# Patient Record
Sex: Female | Born: 1970 | Race: White | Hispanic: No | Marital: Married | Smoking: Never smoker
Health system: Southern US, Community
[De-identification: ages and names within clinical notes are randomized; demographics above are authoritative.]

## PROBLEM LIST (undated history)

## (undated) DIAGNOSIS — R011 Cardiac murmur, unspecified: Secondary | ICD-10-CM

## (undated) DIAGNOSIS — R9431 Abnormal electrocardiogram [ECG] [EKG]: Secondary | ICD-10-CM

## (undated) DIAGNOSIS — I340 Nonrheumatic mitral (valve) insufficiency: Secondary | ICD-10-CM

## (undated) DIAGNOSIS — I499 Cardiac arrhythmia, unspecified: Secondary | ICD-10-CM

## (undated) HISTORY — DX: Nonrheumatic mitral (valve) insufficiency: I34.0

## (undated) HISTORY — DX: Cardiac arrhythmia, unspecified: I49.9

## (undated) HISTORY — DX: Cardiac murmur, unspecified: R01.1

## (undated) HISTORY — DX: Abnormal electrocardiogram (ECG) (EKG): R94.31

---

## 2000-07-28 ENCOUNTER — Encounter: Admission: RE | Admit: 2000-07-28 | Discharge: 2000-07-28 | Payer: Self-pay | Admitting: Emergency Medicine

## 2000-07-28 ENCOUNTER — Encounter: Payer: Self-pay | Admitting: Emergency Medicine

## 2002-06-09 ENCOUNTER — Other Ambulatory Visit: Admission: RE | Admit: 2002-06-09 | Discharge: 2002-06-09 | Payer: Self-pay | Admitting: Emergency Medicine

## 2004-10-30 ENCOUNTER — Other Ambulatory Visit: Admission: RE | Admit: 2004-10-30 | Discharge: 2004-10-30 | Payer: Self-pay | Admitting: Gynecology

## 2004-10-31 ENCOUNTER — Ambulatory Visit (HOSPITAL_COMMUNITY): Admission: RE | Admit: 2004-10-31 | Discharge: 2004-10-31 | Payer: Self-pay | Admitting: Sports Medicine

## 2004-11-06 ENCOUNTER — Ambulatory Visit (HOSPITAL_COMMUNITY): Admission: RE | Admit: 2004-11-06 | Discharge: 2004-11-06 | Payer: Self-pay | Admitting: Internal Medicine

## 2004-11-06 ENCOUNTER — Encounter (INDEPENDENT_AMBULATORY_CARE_PROVIDER_SITE_OTHER): Payer: Self-pay | Admitting: *Deleted

## 2004-11-20 ENCOUNTER — Ambulatory Visit: Payer: Self-pay | Admitting: Cardiology

## 2005-02-11 ENCOUNTER — Encounter: Payer: Self-pay | Admitting: Cardiology

## 2005-02-11 ENCOUNTER — Ambulatory Visit (HOSPITAL_COMMUNITY): Admission: RE | Admit: 2005-02-11 | Discharge: 2005-02-11 | Payer: Self-pay | Admitting: Cardiology

## 2005-02-11 ENCOUNTER — Ambulatory Visit: Payer: Self-pay | Admitting: Cardiology

## 2006-01-04 ENCOUNTER — Ambulatory Visit (HOSPITAL_COMMUNITY): Admission: RE | Admit: 2006-01-04 | Discharge: 2006-01-04 | Payer: Self-pay | Admitting: Obstetrics & Gynecology

## 2006-03-03 ENCOUNTER — Inpatient Hospital Stay (HOSPITAL_COMMUNITY): Admission: AD | Admit: 2006-03-03 | Discharge: 2006-03-06 | Payer: Self-pay | Admitting: Obstetrics & Gynecology

## 2006-03-04 ENCOUNTER — Encounter (INDEPENDENT_AMBULATORY_CARE_PROVIDER_SITE_OTHER): Payer: Self-pay | Admitting: Specialist

## 2007-09-29 ENCOUNTER — Inpatient Hospital Stay (HOSPITAL_COMMUNITY): Admission: AD | Admit: 2007-09-29 | Discharge: 2007-09-29 | Payer: Self-pay | Admitting: Obstetrics

## 2007-10-31 ENCOUNTER — Encounter: Payer: Self-pay | Admitting: Obstetrics & Gynecology

## 2007-10-31 ENCOUNTER — Ambulatory Visit: Payer: Self-pay | Admitting: Surgery

## 2007-10-31 ENCOUNTER — Ambulatory Visit (HOSPITAL_COMMUNITY): Admission: RE | Admit: 2007-10-31 | Discharge: 2007-10-31 | Payer: Self-pay | Admitting: Obstetrics & Gynecology

## 2007-11-29 ENCOUNTER — Encounter: Payer: Self-pay | Admitting: Obstetrics & Gynecology

## 2007-11-29 ENCOUNTER — Inpatient Hospital Stay (HOSPITAL_COMMUNITY): Admission: RE | Admit: 2007-11-29 | Discharge: 2007-12-02 | Payer: Self-pay | Admitting: Obstetrics & Gynecology

## 2010-07-22 NOTE — Discharge Summary (Signed)
Connie Kirk, Connie Kirk             ACCOUNT NO.:  [id number]   MEDICAL RECORD NO.:  [id number]          PATIENT TYPE:  INP   LOCATION:  9117                          [facility]ILITY[facility]  [facility]   PHYSICIAN:  Charles A. Clearance Coots, M.D.DATE OF BIRTH:  06-Nov-1970   DATE OF ADMISSION:  11/29/2007  DATE OF DISCHARGE:  12/02/2007                               DISCHARGE SUMMARY   ADMITTING DIAGNOSES:  A 37 weeks' gestation twins, transverse lie and  breech presentation.   POSTOPERATIVE:  A 37 weeks' gestation twins, transverse lie and breech  presentation, status post primary low-transverse cesarean section on  November 29, 2007.   Viable twin infants were delivered, baby A female delivered at 11:10,  Apgars of 9 at 1 minute and 9 at 5 minutes, weight of 2505 grams, length  of 46.99 cm.  Baby B delivered at 11:12 female, Apgars of 9 at 1 minute  and 9 at 5 minutes, weight of 2915 grams, and length of 48.87 cm.  Mother and both infants discharged home in good condition.   REASON FOR ADMISSION:  A 37-year white female G2, P1, twin gestation at  54 weeks' gestation, presents for cesarean section, delivery for  transverse lie and breech presentation.  The patient has been followed  collaboratively at [facility].  Serial recent ultrasound  demonstrated concordant and an appropriate growth, however, the  presenting twin has been transverse lie and pregnancy as a result of  assisted reproductive technology.   PAST MEDICAL HISTORY AND PAST SURGERY ILLNESSES:  None.   MEDICATIONS:  Prenatal vitamins.   ALLERGIES:  No known drug allergies.   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  She denies any tobacco, alcohol or drug  use.   FAMILY HISTORY:  Positive for diabetes, hypertension, cardiovascular  disease, and Parkinson's disease.   PHYSICAL EXAMINATION:  GENERAL:  Well-nourished and well-developed  female, in no acute distress.  VITAL SIGNS:  Blood pressure 132/74.  She is afebrile.  LUNGS:  Clear to auscultation bilaterally.  HEART: Regular rate and rhythm.  ABDOMEN:  Gravid, nontender.   ADMITTING LABS:  Hemoglobin 12, hematocrit 37, white blood cell count  10,000, and platelets 168,000   HOSPITAL COURSE:  The patient underwent primary low-transverse cesarean  section on November 29, 2007.  There were no intraoperative  complications.  Postoperative course was uncomplicated.  The patient was  discharged home on postop day #3, in good condition.   DISCHARGE LABS:  Hemoglobin 9, hematocrit 26, white blood cell count  11,400, and platelets 139,000   DISCHARGE DISPOSITION:  Medications; Darvocet-N 100 and ibuprofen was  prescribed for pain.  Continue prenatal vitamins.  Iron was prescribed  for anemia.  Routine written instructions were given for discharge after  cesarean section.  The patient is to call office for followup  appointment IN 2 weeks.      Charles A. Clearance Coots, M.D.  Electronically Signed     CAH/MEDQ  D:  12/02/2007  T:  12/02/2007  Job:  [id number]

## 2010-07-22 NOTE — Op Note (Signed)
NAMEJERZY, Connie Kirk             ACCOUNT NO.:  [id number]   MEDICAL RECORD NO.:  [id number]          PATIENT TYPE:  INP   LOCATION:  9117                          [facility]  [facility]   PHYSICIAN:  Roseanna Rainbow, M.D.DATE OF BIRTH:  November 27, 1970   DATE OF PROCEDURE:  11/29/2007  DATE OF DISCHARGE:                               OPERATIVE REPORT   PREOPERATIVE DIAGNOSIS:  Twin gestation at 62 plus weeks, twin A  transverse lie.   POSTOPERATIVE DIAGNOSES:  Twin gestation, transverse/breech  presentations.   PROCEDURE:  Primary low uterine flap elliptical cesarean delivery, via  Pfannenstiel skin incision.   SURGEON:  [facility] anesthesia.   SURGEONS:  Roseanna Rainbow, MD, and Bing Neighbors. Clearance Coots, MD   ANESTHESIA:  Spinal.   PATHOLOGY:  Placenta.   ESTIMATED BLOOD LOSS:  600 mL.   COMPLICATIONS:  None.   PROCEDURE IN DETAIL:  The patient was taken to the operating room with  an IV running.  She was then placed in the dorsal supine position with  leftward tilt and prepped and draped in the usual sterile fashion.  After a time-out had been completed, a Pfannenstiel skin incision was  then made with a scalpel and carried down to the underlying fascia.  The  fascia was nicked in the midline.  The fascial incision was then  extended bilaterally with curved Mayo scissors.  The superior aspect of  the fascial incision was then tented up and the underlying rectus  muscles were dissected off.  The inferior aspect of the fascial incision  was manipulated in a similar fashion.  The rectus muscles were then  dissected off.  The rectus muscles were separated in the midline.  The  parietal peritoneum was then tented up and entered sharply.  This  incision was then extended superiorly and inferiorly with good  visualization of the bladder.  The bladder blade was then placed.  The  vesicouterine peritoneum was tented up and entered sharply.  This  incision was then extended  bilaterally and the bladder flap created  bluntly.  The bladder blade was then replaced.  The lower uterine  segment was then incised in a transverse fashion with the scalpel.  This  incision was then extended bluntly.  Twin A was then delivered as a  complete breech extraction.  The infant's head was delivered  atraumatically.  The oropharynx was suctioned with a bulb suction.  The  cord was clamped and cut and the infant was brought to the awaiting  neonatologist.  The membranes for twin B was then ruptured.  Twin B was  then delivered as a complete breech extraction.  The infant's head was  delivered atraumatically.  The oropharynx was suctioned with bulb  suction and the cord was clamped and cut, and the infant was then  brought to the awaiting neonatologist.  The placenta was then removed.  The intrauterine cavity was evacuated of any remaining amniotic fluid,  blood clots and debris with moistened laparotomy sponge.  The uterine  incision was then reapproximated in a running-interlocking fashion using  a suture of 0 Monocryl.  A  second imbricating layer of the same suture  was then placed.  Individual bleeding points were secured with figure-of-  eight sutures of the same.  The paracolic gutters were then copiously  irrigated.  The incision was again reinspected and felt to be  hemostatic.  The parietal peritoneum was then reapproximated in a  running fashion using a suture of 2-0 Vicryl.  The fascia was closed in  a  running fashion using 2 sutures of 0 Vicryl.  The skin was closed in a  subcuticular fashion using a running suture of 3-0 Monocryl.  At the  close of the procedure, the instrument and pack counts were said to be  correct x2.  The patient was taken to the PACU awake and in stable  condition.      Roseanna Rainbow, M.D.  Electronically Signed     LAJ/MEDQ  D:  11/29/2007  T:  11/30/2007  Job:  [id number]

## 2010-07-25 NOTE — H&P (Signed)
Connie Kirk, Connie Kirk             ACCOUNT NO.:  [id number]   MEDICAL RECORD NO.:  [id number]          PATIENT TYPE:  INP   LOCATION:  9169                          [facility]  [facility]   PHYSICIAN:  Roseanna Rainbow, M.D.DATE OF BIRTH:  1970-09-01   DATE OF ADMISSION:  03/03/2006  DATE OF DISCHARGE:                              HISTORY & PHYSICAL   CHIEF COMPLAINT:  The patient is a 40 year old para 0 with an estimated  date of confinement of December 30 with an intrauterine pregnancy at 39+  weeks who presents for induction of labor.   HISTORY OF PRESENT ILLNESS:  Please see the above.   ALLERGIES:  NO KNOWN DRUG ALLERGIES.   MEDICATIONS:  Please see the reconciliation sheet.   RISK FACTORS:  She has a succenturiate lobe, a history of  oligohydramnios that has resolved.  She is Rh negative, nonsensitized  and this was an in vitro fertilization pregnancy.   LABORATORY DATA:  Chlamydia is negative.  One hour GCT 138.  GC  negative.  Hepatitis B surface antigen negative.  Hematocrit 33.7.  Hemoglobin 14.2.  HIV nonreactive.  Pap smear negative.  Platelets  333,000.  RPR nonreactive.  Rubella immune.  Blood type is negative.  Antibody screen negative.  GBS is negative on November 26.   PAST GYN HISTORY:  Primary fertility.   PAST MEDICAL HISTORY:  No significant history of medical diseases.   PAST SURGICAL HISTORY:  LASIK eye surgery.   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  She is married,  lives with her spouse, does not give any significant history of alcohol  abuse.  She has had no significant smoking history.  Denies illicit drug  use.   FAMILY HISTORY:  Alcoholism, bone cancer, breast cancer, cerebrovascular  accidents, adult-onset diabetes, heart disease, hypercholesterolemia,  hypertension, myocardial infarction and Parkinson's.   PHYSICAL EXAMINATION:  VITAL SIGNS:  Stable.  Afebrile.  FETAL HEART TRACING:  Reassuring.  TOCODYNAMOMETER:  No uterine  contractions.  STERILE VAGINAL EXAM:  The cervix is 1, 50% with a vertex at a -1  station.   ASSESSMENT:  Intrauterine pregnancy at 39+ weeks for induction of labor,  borderline Bishop score, fetal heart tracing consistent with fetal well  being.   PLAN:  Admission, two stage induction.  We will begin with cervical  ripening with Cytotec.      Roseanna Rainbow, M.D.  Electronically Signed     LAJ/MEDQ  D:  03/03/2006  T:  03/03/2006  Job:  [id number]

## 2010-07-25 NOTE — H&P (Signed)
NAMEEARLEE, Connie Kirk             ACCOUNT NO.:  [id number]   MEDICAL RECORD NO.:  [id number]          PATIENT TYPE:  OUT   LOCATION:  VASC                         FACILITY:  [facility]   PHYSICIAN:  Roseanna Rainbow, M.D.DATE OF BIRTH:  [birth year]   DATE OF ADMISSION:  10/31/2007  DATE OF DISCHARGE:  10/31/2007                              HISTORY & PHYSICAL   CHIEF COMPLAINT:  The patient is a 40 year old para 1 with an estimated  date of confinement of October 10 with a twin gestation at 34 plus  weeks.  The presenting twin is a nonvertex prenatation.  She is for an  elective cesarean delivery.   HISTORY OF PRESENT ILLNESS:  The patient has been followed  collaboratively at [facility].  Serial recent ultrasounds  have demonstrated concordant, appropriate growth.  However, the  presenting twin has been nonvertex transverse lie.  The pregnancy is the  result of ART.   ALLERGIES:  No known drug allergies.   MEDICATIONS:  Please see the medication reconciliation form.   OB RISK FACTORS:  Please see the above, advanced maternal age, Rh-  negative nonsensitized.   PAST OBSTETRICAL HISTORY:  In December 2007, she was delivered of a live  born female, full term, 6 pounds 8 ounces, vaginal delivery.   PRENATAL LABS:  Blood type O negative.  Antibody screen negative.  Chlamydia probe negative.  Urine culture and sensitivity insignificant  growth.  Pap smear negative.  One-hour GTT 110.  Hepatitis B surface  antigen negative.  Hematocrit 38.6, hemoglobin 12.3, HIV nonreactive,  hemoglobin A1c on August 11, 2007, 5.1, and platelets 196.  RPR  nonreactive, rubella immune, and varicella immune.  Ultrasound on  November 10, 2007, at 34 weeks 5 days, twin A - normal amniotic fluid,  anterior placenta, presentations variable breech transverse.  Twin B -  normal amniotic fluid, posterior placenta.  Twin A estimated fetal  weight at the 53rd percentile.  Twin B at the 49th  percentile,  dichorionic diamniotic. First trimester Down syndrome screening,  increased Down syndrome risk for twin A and twin B.   PAST GYN HISTORY:  Primary infertility.   PAST MEDICAL HISTORY:  No significant history of medical diseases.   PAST SURGICAL HISTORY:  Eye surgery.   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  She is married and  living with her spouse.  She denies any significant tobacco, ethanol, or  drug use.   FAMILY HISTORY:  Alcoholism, arthritis, adult-onset diabetes,  hypercholesterolemia, hypertension, myocardial infarction, and Parkinson  disease.   PHYSICAL EXAMINATION:  VITAL SIGNS:  Blood pressure 132/74.  Fetal heart  tracings reactive x2.  GENERAL:  No apparent distress.  LUNGS:  Clear to auscultation bilaterally.  HEART:  Regular rate and rhythm.  ABDOMEN:  Gravid.  PELVIC:  Deferred.   ASSESSMENT:  Twin gestation and diamniotic dichorionic at 41 plus weeks,  concordant growth, and nonvertex presentation for a twin A, now for an  elective cesarean delivery.   PLAN:  Admission, cesarean delivery.  The risks, benefits, and  alternative forms of management were reviewed with  the patient and  informed consent had been obtained.      Roseanna Rainbow, M.D.  Electronically Signed     LAJ/MEDQ  D:  11/28/2007  T:  11/29/2007  Job:  [id number]

## 2010-07-30 ENCOUNTER — Encounter: Payer: Self-pay | Admitting: Cardiology

## 2010-08-15 ENCOUNTER — Encounter: Payer: Self-pay | Admitting: Cardiology

## 2010-08-18 ENCOUNTER — Encounter: Payer: Self-pay | Admitting: Cardiology

## 2010-08-18 ENCOUNTER — Ambulatory Visit (INDEPENDENT_AMBULATORY_CARE_PROVIDER_SITE_OTHER): Payer: BC Managed Care – PPO | Admitting: Cardiology

## 2010-08-18 VITALS — BP 115/79 | HR 57 | Resp 16 | Ht 63.0 in | Wt 130.0 lb

## 2010-08-18 DIAGNOSIS — I059 Rheumatic mitral valve disease, unspecified: Secondary | ICD-10-CM

## 2010-08-18 DIAGNOSIS — I340 Nonrheumatic mitral (valve) insufficiency: Secondary | ICD-10-CM | POA: Insufficient documentation

## 2010-08-18 DIAGNOSIS — R9431 Abnormal electrocardiogram [ECG] [EKG]: Secondary | ICD-10-CM

## 2010-08-18 HISTORY — DX: Nonrheumatic mitral (valve) insufficiency: I34.0

## 2010-08-18 NOTE — Assessment & Plan Note (Signed)
She presents for routine follow up with one episode of chest pain (atypical) and her markedly abnormal EKG.  I will obtain the old echo and EKG.  I would like to screen her with an ETT.  I will bring the patient back for a POET (Plain Old Exercise Test). This will allow me to screen for obstructive coronary disease, risk stratify and very importantly provide a prescription for exercise.

## 2010-08-18 NOTE — Progress Notes (Signed)
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  I saw her several years ago she had right bundle branch block T-wave inversion. At that time echo demonstrated no abnormalities. Since then she denies any chest pressure, neck or arm discomfort. She has had no palpitations, presyncope or syncope. She has had no shortness of breath, PND or orthopnea. She has had no weight gain or edema. She does remain active exercising though taking care of 3 small children keeps her very busy.  She did have one episode of chest discomfort. This was in the middle of the night. It woke her from her sleep. There was diaphoresis. It was self-limited. She had not had this before. She has not had this since. Prior to that she had been having some mild GI complaints.   No Known Allergies  Current Outpatient Prescriptions  Medication Sig Dispense Refill  . Calcium Carbonate-Vit D-Min (CALCIUM 1200 PO) Take by mouth daily.        . Multiple Vitamin (MULTIVITAMIN) capsule Take 1 capsule by mouth daily.        Marland Kitchen VITAMIN D, CHOLECALCIFEROL, PO Take by mouth daily.          Past Medical History  Diagnosis Date  . Irregular heartbeat   . Abnormal EKG     No past surgical history on file.  Family History  Problem Relation Age of Onset  . Diabetes      family hx of  . Hypertension      family hx of  . Coronary artery disease      family hx of  . Abnormal EKG Father     History   Social History  . Marital Status: Married    Spouse Name: N/A    Number of Children: 3  . 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 Smoking status: Never Smoker   . Smokeless tobacco: Not on file  . Alcohol Use: No  . Drug Use: No  . Sexually Active: Not on file   Other Topics Concern  . Not on file   Social History Narrative  . No narrative on file    ROS:  As stated in the HPI and negative for all other systems.   PHYSICAL EXAM BP  115/79  Pulse 57  Resp 16  Ht 5\' 3"  (1.6 m)  Wt 130 lb (58.968 kg)  BMI 23.03 kg/m2 GENERAL:  Well appearing NECK:  No jugular venous distention, waveform within normal limits, carotid upstroke brisk and symmetric, no bruits, no thyromegaly LYMPHATICS:  No cervical, inguinal adenopathy LUNGS:  Clear to auscultation bilaterally BACK:  No CVA tenderness CHEST:  Unremarkable HEART:  PMI not displaced or sustained,S1 and S2 within normal limits, no S3, no S4, no clicks, no rubs, no murmurs ABD:  Flat, positive bowel sounds normal in frequency in pitch, no bruits, no rebound, no guarding, no midline pulsatile mass, no hepatomegaly, no splenomegaly EXT:  2 plus pulses throughout, no edema, no cyanosis no clubbing SKIN:  No rashes no nodules NEURO:  Cranial nerves II through XII grossly intact, motor grossly intact throughout PSYCH:  Cognitively intact, oriented to person place and time   EKG:  Sinus bradycardia, rate 53, right bundle branch block, inferior T-wave inversion without the old EKG for comparison.  ASSESSMENT AND PLAN

## 2010-08-18 NOTE — Patient Instructions (Signed)
You are being scheduled for a treadmill exercise test.  Please follow the instructions given. Continue current medications.

## 2010-08-18 NOTE — Assessment & Plan Note (Signed)
I will look at the old report and decide on repeat testing.

## 2010-11-06 ENCOUNTER — Encounter: Payer: BC Managed Care – PPO | Admitting: Cardiology

## 2010-12-01 ENCOUNTER — Ambulatory Visit (INDEPENDENT_AMBULATORY_CARE_PROVIDER_SITE_OTHER): Payer: BC Managed Care – PPO | Admitting: Cardiology

## 2010-12-01 ENCOUNTER — Encounter: Payer: BC Managed Care – PPO | Admitting: Cardiology

## 2010-12-01 DIAGNOSIS — R9431 Abnormal electrocardiogram [ECG] [EKG]: Secondary | ICD-10-CM

## 2010-12-01 NOTE — Progress Notes (Signed)
Exercise Treadmill Test  Pre-Exercise Testing Evaluation Rhythm: normal sinus  Rate: 69   PR:  .16 QRS:  .14  QT:  .43 QTc: .46     Test  Exercise Tolerance Test Ordering MD: Angelina Sheriff, MD  Interpreting MD:  Angelina Sheriff, MD  Unique Test No: 1  Treadmill:  1  Indication for ETT: Abnormal EKG  Contraindication to ETT: No   Stress Modality: exercise - treadmill  Cardiac Imaging Performed: non   Protocol: standard Bruce - maximal  Max BP:  163/87  Max MPHR (bpm):  181 85% MPR (bpm):  153  MPHR obtained (bpm):  160 % MPHR obtained:  87  Reached 85% MPHR (min:sec):  9:16 Total Exercise Time (min-sec):  11:00  Workload in METS:  13.4 Borg Scale: 13  Reason ETT Terminated:  desired heart rate attained    ST Segment Analysis At Rest: normal ST segments - no evidence of significant ST depression With Exercise: no evidence of significant ST depression  Other Information Arrhythmia:  No Angina during ETT:  absent (0) Quality of ETT:  diagnostic  ETT Interpretation:  normal - no evidence of ischemia by ST analysis  Comments: The patient had an excellent exercise tolerance.  There was no chest pain.  There was an appropriate level of dyspnea.  There were no arrhythmias, a normal heart rate response and normal BP response.  There were no ischemic ST T wave changes and a normal heart rate recovery.  Recommendations: Negative adequate ETT.  No further testing is indicated.  Based on the above I gave the patient a prescription for exercise.

## 2010-12-05 LAB — RH IMMUNE GLOBULIN WORKUP (NOT WOMEN'S HOSP)
ABO/RH(D): O NEG
Antibody Screen: NEGATIVE

## 2010-12-08 LAB — CBC
HCT: 26.7 — ABNORMAL LOW
HCT: 37.1
MCHC: 34
MCV: 95
MCV: 96.3
Platelets: 168
RBC: 2.78 — ABNORMAL LOW
RDW: 15.1
WBC: 11.4 — ABNORMAL HIGH

## 2010-12-08 LAB — TYPE AND SCREEN: DAT, IgG: NEGATIVE

## 2011-03-24 ENCOUNTER — Ambulatory Visit (INDEPENDENT_AMBULATORY_CARE_PROVIDER_SITE_OTHER): Payer: BC Managed Care – PPO | Admitting: Family Medicine

## 2011-03-24 ENCOUNTER — Encounter: Payer: Self-pay | Admitting: Family Medicine

## 2011-03-24 VITALS — BP 108/70 | HR 84 | Temp 98.3°F | Wt 133.0 lb

## 2011-03-24 DIAGNOSIS — R110 Nausea: Secondary | ICD-10-CM

## 2011-03-24 DIAGNOSIS — G8929 Other chronic pain: Secondary | ICD-10-CM

## 2011-03-24 DIAGNOSIS — R51 Headache: Secondary | ICD-10-CM

## 2011-03-24 NOTE — Progress Notes (Signed)
  Subjective:    Patient ID: Connie Kirk, female    DOB: 01-Jan-1971, 40 y.o.   MRN: [id number]  HPI  Patient seen to establish care. Past medical history reviewed. She has no significant chronic medical problems. She has history of abnormal EKG and recent echocardiogram with insignificant mitral regurgitation. She sees a gynecologist regularly for routine visits. Uses Mirena.  No prior surgeries.  She is married and has 3 children. 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 Nonsmoker.  Seen today with issue of frequent headaches for the past few months. Headaches are relatively diffuse. Location is bilateral and somewhat of a bandlike distribution. She has associated nausea but no vomiting. She denies any photophobia. Severity of headache is 3/10. Generally dull headache. Symptoms are somewhat intermittent over the 3 months. These are nonexertional. Minimal caffeine use. Ibuprofen helps. No clear exacerbating factors. No history of injury. No sinusitis symptoms. Denies any focal neurologic symptoms. Recent pregnancy screen negative.   Past Medical History  Diagnosis Date  . Irregular heartbeat   . Abnormal EKG   . Heart murmur    Past Surgical History  Procedure Date  . Cesarean section     sept 2009    reports that she has never smoked. She does not have any smokeless tobacco history on file. She reports that she does not drink alcohol or use illicit drugs. family history includes Abnormal EKG in her father; Alcohol abuse in her mother; Arthritis in her father and mother; Cancer in her father and paternal grandmother; Coronary artery disease in an unspecified family member; Diabetes in her maternal grandmother and unspecified family member; Heart disease in her maternal grandmother and paternal grandfather; Hyperlipidemia in her father; Hypertension in an unspecified family member; and Sudden death in her paternal grandfather. No  Known Allergies    Review of Systems  Constitutional: Negative for fever, appetite change and unexpected weight change.  HENT: Negative for trouble swallowing.   Eyes: Negative for visual disturbance.  Respiratory: Negative for cough and shortness of breath.   Neurological: Positive for headaches. Negative for dizziness, seizures, syncope, weakness and numbness.  Hematological: Negative for adenopathy.       Objective:   Physical Exam  Constitutional: She is oriented to person, place, and time. She appears well-developed and well-nourished. No distress.  HENT:  Mouth/Throat: Oropharynx is clear and moist.  Neck: Neck supple. No thyromegaly present.  Cardiovascular: Normal rate and regular rhythm.   Pulmonary/Chest: Effort normal and breath sounds normal. No respiratory distress. She has no wheezes. She has no rales.  Musculoskeletal: She exhibits no edema.  Lymphadenopathy:    She has no cervical adenopathy.  Neurological: She is alert and oriented to person, place, and time. She has normal reflexes. No cranial nerve deficit. Coordination normal.          Assessment & Plan:  Persistent headaches. These do not sound vascular. Atypical feature includes associated nausea and chronicity. She has generally not had significant headaches in the past.  We'll set up MRI head to further assess

## 2011-03-25 ENCOUNTER — Other Ambulatory Visit: Payer: Self-pay | Admitting: Family Medicine

## 2011-03-25 DIAGNOSIS — R519 Headache, unspecified: Secondary | ICD-10-CM

## 2011-04-07 ENCOUNTER — Inpatient Hospital Stay: Admission: RE | Admit: 2011-04-07 | Payer: BC Managed Care – PPO | Source: Ambulatory Visit

## 2011-07-21 ENCOUNTER — Encounter: Payer: Self-pay | Admitting: Internal Medicine

## 2011-08-18 ENCOUNTER — Ambulatory Visit (AMBULATORY_SURGERY_CENTER): Payer: BC Managed Care – PPO

## 2011-08-18 VITALS — Ht 63.0 in | Wt 132.0 lb

## 2011-08-18 DIAGNOSIS — Z1211 Encounter for screening for malignant neoplasm of colon: Secondary | ICD-10-CM

## 2011-08-18 DIAGNOSIS — Z800 Family history of malignant neoplasm of digestive organs: Secondary | ICD-10-CM

## 2011-08-18 MED ORDER — MOVIPREP 100 G PO SOLR: ORAL | Status: DC

## 2011-09-01 ENCOUNTER — Encounter: Payer: Self-pay | Admitting: Internal Medicine

## 2011-09-01 ENCOUNTER — Ambulatory Visit (AMBULATORY_SURGERY_CENTER): Payer: BC Managed Care – PPO | Admitting: Internal Medicine

## 2011-09-01 VITALS — BP 118/75 | HR 50 | Temp 97.9°F | Resp 18 | Ht 63.0 in | Wt 132.0 lb

## 2011-09-01 DIAGNOSIS — Z1211 Encounter for screening for malignant neoplasm of colon: Secondary | ICD-10-CM

## 2011-09-01 DIAGNOSIS — Z800 Family history of malignant neoplasm of digestive organs: Secondary | ICD-10-CM

## 2011-09-01 MED ORDER — SODIUM CHLORIDE 0.9 % IV SOLN: 500.0000 mL | INTRAVENOUS | Status: DC

## 2011-09-01 NOTE — Op Note (Signed)
[facility] [address] [place], [place]  [address]  COLONOSCOPY PROCEDURE REPORT  PATIENT:  Connie Kirk, Connie Kirk  MR#:  [id number] BIRTHDATE:  05/31/1970, 41 yrs. old  GENDER:  female ENDOSCOPIST:  Carie Caddy. Amaru Burroughs, MD REF. BY:  Evelena Peat, M.D. PROCEDURE DATE:  09/01/2011 PROCEDURE:  Colonoscopy 19147 ASA CLASS:  Class I INDICATIONS:  Elevated Risk Screening, family history of colon cancer (father, age 40), 1st colonoscopy MEDICATIONS:   MAC sedation, administered by CRNA, propofol (Diprivan) 150 mg IV  DESCRIPTION OF PROCEDURE:   After the risks benefits and alternatives of the procedure were thoroughly explained, informed consent was obtained.  Digital rectal exam was performed and revealed no rectal masses.   The LB CF-[id number] endoscope was introduced through the anus and advanced to the terminal ileum which was intubated for a short distance, without limitations. The quality of the prep was excellent, using MoviPrep.  The instrument was then slowly withdrawn as the colon was fully examined. <<PROCEDUREIMAGES>> FINDINGS:  The terminal ileum appeared normal.  A normal appearing cecum, ileocecal valve, and appendiceal orifice were identified. The ascending, hepatic flexure, transverse, splenic flexure, descending, sigmoid colon, and rectum appeared unremarkable. Retroflexed views in the rectum revealed no abnormalities.   The scope was then withdrawn  from the cecum and the procedure completed.  COMPLICATIONS:  None  ENDOSCOPIC IMPRESSION: 1) Normal terminal ileum 2) Normal colon  RECOMMENDATIONS: 1) Given your significant family history of colon cancer, you should have a repeat colonoscopy in 5 years  Camber Ninh M. Rhea Belton, MD  CC:  Evelena Peat, MD The Patient  n. eSIGNEDCarie Caddy. Cruise Baumgardner at 09/01/2011 11:00 AM  Albertha Ghee, [phone]

## 2011-09-01 NOTE — Patient Instructions (Addendum)
Normal Colonoscopy.  Repeat colonoscopy in five years with your family history.  YOU HAD AN ENDOSCOPIC PROCEDURE TODAY AT THE [facility]: Refer to the procedure report that was given to you for any specific questions about what was found during the examination.  If the procedure report does not answer your questions, please call your gastroenterologist to clarify.  If you requested that your care partner not be given the details of your procedure findings, then the procedure report has been included in a sealed envelope for you to review at your convenience later.  YOU SHOULD EXPECT: Some feelings of bloating in the abdomen. Passage of more gas than usual.  Walking can help get rid of the air that was put into your GI tract during the procedure and reduce the bloating. If you had a lower endoscopy (such as a colonoscopy or flexible sigmoidoscopy) you may notice spotting of blood in your stool or on the toilet paper. If you underwent a bowel prep for your procedure, then you may not have a normal bowel movement for a few days.  DIET: Your first meal following the procedure should be a light meal and then it is ok to progress to your normal diet.  A half-sandwich or bowl of soup is an example of a good first meal.  Heavy or fried foods are harder to digest and may make you feel nauseous or bloated.  Likewise meals heavy in dairy and vegetables can cause extra gas to form and this can also increase the bloating.  Drink plenty of fluids but you should avoid alcoholic beverages for 24 hours.  ACTIVITY: Your care partner should take you home directly after the procedure.  You should plan to take it easy, moving slowly for the rest of the day.  You can resume normal activity the day after the procedure however you should NOT DRIVE or use heavy machinery for 24 hours (because of the sedation medicines used during the test).    SYMPTOMS TO REPORT IMMEDIATELY: A gastroenterologist can be reached at  any hour.  During normal business hours, 8:30 AM to 5:00 PM Monday through Friday, call [phone].  After hours and on weekends, please call the GI answering service at [phone] who will take a message and have the physician on call contact you.   Following lower endoscopy (colonoscopy or flexible sigmoidoscopy):  Excessive amounts of blood in the stool  Significant tenderness or worsening of abdominal pains  Swelling of the abdomen that is new, acute  Fever of 100F or higher  Following upper endoscopy (EGD)  Vomiting of blood or coffee ground material  New chest pain or pain under the shoulder blades  Painful or persistently difficult swallowing  New shortness of breath  Fever of 100F or higher  Black, tarry-looking stools  FOLLOW UP: If any biopsies were taken you will be contacted by phone or by letter within the next 1-3 weeks.  Call your gastroenterologist if you have not heard about the biopsies in 3 weeks.  Our staff will call the home number listed on your records the next business day following your procedure to check on you and address any questions or concerns that you may have at that time regarding the information given to you following your procedure. This is a courtesy call and so if there is no answer at the home number and we have not heard from you through the emergency physician on call, we will assume that you have returned  to your regular daily activities without incident.  SIGNATURES/CONFIDENTIALITY: You and/or your care partner have signed paperwork which will be entered into your electronic medical record.  These signatures attest to the fact that that the information above on your After Visit Summary has been reviewed and is understood.  Full responsibility of the confidentiality of this discharge information lies with you and/or your care-partner.   Please follow all discharge instructions given to you by the recovery room nurse. If you have any  questions or problems after discharge please call one of the numbers listed above. You will receive a phone call in the am to see how you are doing and answer any questions you may have. Thank you for choosing [facility] for your health care needs.

## 2011-09-01 NOTE — Progress Notes (Signed)
Patient did not experience any of the following events: a burn prior to discharge; a fall within the facility; wrong site/side/patient/procedure/implant event; or a hospital transfer or hospital admission upon discharge from the facility. (G8907) Patient did not have preoperative order for IV antibiotic SSI prophylaxis. (G8918)  

## 2011-09-02 ENCOUNTER — Telehealth: Payer: Self-pay | Admitting: *Deleted

## 2011-09-02 NOTE — Telephone Encounter (Signed)
  Follow up Call-  Call back number 09/01/2011  Post procedure Call Back phone  # [phone]  Permission to leave phone message Yes     Patient questions:  Message left to call us if necessary.

## 2012-11-15 ENCOUNTER — Other Ambulatory Visit: Payer: Self-pay

## 2014-07-17 ENCOUNTER — Other Ambulatory Visit: Payer: Self-pay

## 2014-07-17 LAB — HM MAMMOGRAPHY

## 2014-07-17 LAB — BASIC METABOLIC PANEL
BUN: 18 (ref 4–21)
Creatinine: 0.8 (ref 0.5–1.1)
Glucose: 86

## 2014-07-17 LAB — TSH: TSH: 2.6 (ref 0.41–5.90)

## 2014-07-17 LAB — HEPATIC FUNCTION PANEL: BILIRUBIN, TOTAL: 0.6

## 2014-07-17 LAB — HM PAP SMEAR: HM Pap smear: NORMAL

## 2014-07-17 LAB — VITAMIN D 25 HYDROXY (VIT D DEFICIENCY, FRACTURES): VIT D 25 HYDROXY: 54

## 2014-07-18 LAB — CYTOLOGY - PAP

## 2015-07-16 ENCOUNTER — Telehealth: Payer: Self-pay | Admitting: Cardiology

## 2015-07-16 NOTE — Telephone Encounter (Signed)
07/16/2015 Received faxed referral from [facility] for upcoming appointment on 08/08/2015.  [facility]

## 2015-08-07 NOTE — Progress Notes (Signed)
Cardiology Office Note   Date:  08/08/2015   ID:  Connie Kirk, DOB February 26, 1971, MRN [id number]  PCP:  Kristian Covey, MD  Cardiologist:   Rollene Rotunda, MD   No chief complaint on file.     History of Present Illness: Connie Kirk is a 45 y.o. female who presents for evaluation of an abnormal EKG. I seen her couple of times over the years for this. She's had a normal echo with normal bubble study in the past. She has a structurally normal heart except for some mild mitral regurgitation. In 2012 she had a negative POET (Plain Old Exercise Treadmill).  She's never had any symptoms. She's been active although not as much in the last several months that she injured her knee. However, she started doing a little jogging and is getting back to some activity and she's not had any cardiovascular symptoms. She denies any chest pressure, neck or arm discomfort. She's not had any presyncope or syncope. She's had no PND or orthopnea.  Of note she has had some brief episodes of rapid heart rate on two occasions.   She said they were short lived and broke with carotid massage.  One was while exercising and another at rest.  However, she otherwise has not been bothered by these.    Past Medical History  Diagnosis Date  . Irregular heartbeat   . Abnormal EKG   . Heart murmur     Past Surgical History  Procedure Laterality Date  . Cesarean section      sept 2009     Current Outpatient Prescriptions  Medication Sig Dispense Refill  . Calcium Carbonate-Vit D-Min (CALCIUM 1200 PO) Take by mouth daily.      Marland Kitchen levonorgestrel (MIRENA) 20 MCG/24HR IUD 1 each by Intrauterine route once.    Marland Kitchen VITAMIN D, CHOLECALCIFEROL, PO Take by mouth daily.       No current facility-administered medications for this visit.    Allergies:   Review of patient's allergies indicates no known allergies.    Social History:  The patient  reports that she has never smoked. She has never used smokeless  tobacco. She reports that she does not drink alcohol or use illicit drugs.   Family History:  The patient's family history includes Abnormal EKG in her father; Alcohol abuse in her mother; Arthritis in her father and mother; Cancer in her father and paternal grandmother; Colon cancer in her father; Diabetes in her maternal grandmother; Heart disease in her maternal grandmother and paternal grandfather; Hyperlipidemia in her father; Sudden death (age of onset: 20) in her paternal grandfather.    ROS:  Please see the history of present illness.   Otherwise, review of systems are positive for none.   All other systems are reviewed and negative.    PHYSICAL EXAM: VS:  BP 114/82 mmHg  Pulse 58  Ht  (1.6 m)  Wt 128 lb 3.2 oz (58.151 kg)  BMI 22.72 kg/m2 , BMI Body mass index is 22.72 kg/(m^2). GENERAL:  Well appearing NECK:  No jugular venous distention, waveform within normal limits, carotid upstroke brisk and symmetric, no bruits, no thyromegaly LUNGS:  Clear to auscultation bilaterally BACK:  No CVA tenderness CHEST:  Unremarkable HEART:  PMI not displaced or sustained,S1 and S2 within normal limits, no S3, no S4, no clicks, no rubs, no murmurs ABD:  Flat, positive bowel sounds normal in frequency in pitch, no bruits, no rebound, no guarding, no midline pulsatile mass,  no hepatomegaly, no splenomegaly EXT:  2 plus pulses throughout, no edema, no cyanosis no clubbing   EKG:  EKG is ordered today. The ekg ordered today demonstrates sinus rhythm, rate 58, right bundle branch block, leftward axis, inferior T-wave inversions. This is unchanged from the previous EKG.   Recent Labs: No results found for requested labs within last 365 days.    Lipid Panel No results found for: CHOL, TRIG, HDL, CHOLHDL, VLDL, LDLCALC, LDLDIRECT    Wt Readings from Last 3 Encounters:  08/08/15 128 lb 3.2 oz (58.151 kg)  09/01/11 132 lb (59.875 kg)  08/18/11 132 lb (59.875 kg)      Other studies  Reviewed: Additional studies/ records that were reviewed today include: Old EKG and labs. Review of the above records demonstrates:  Please see elsewhere in the note.     ASSESSMENT AND PLAN:  ABNORMAL EKG:  The patient's EKG is markedly abnormal but has been thoroughly evaluated and she has actually no symptoms related to this. At this point I don't think that further imaging is indicated and we discussed this.  MR:  She had mild MR previous echo and I don't appreciate any murmurs. We will follow this clinically.  RISK REDUCTION:    She is going to start exercising and let me know if she has any further palpitations or SOB  SVT:  We discussed possible therapies.  However, these are so infrequent that she would not want meds.  In addition they are so infrequent that an event monitor is unlikely to demonstrate one of these events.    Current medicines are reviewed at length with the patient today.  The patient does not have concerns regarding medicines.  The following changes have been made:  no change  Labs/ tests ordered today include: None  No orders of the defined types were placed in this encounter.     Disposition:   FU with me in a couple of years or with continue palpitations or the development of other symptoms.      Signed, Rollene RotundaJames Printice Hellmer, MD  08/08/2015 3:08 PM    [facility]

## 2015-08-08 ENCOUNTER — Encounter: Payer: Self-pay | Admitting: Cardiology

## 2015-08-08 ENCOUNTER — Ambulatory Visit (INDEPENDENT_AMBULATORY_CARE_PROVIDER_SITE_OTHER): Payer: BLUE CROSS/BLUE SHIELD | Admitting: Cardiology

## 2015-08-08 VITALS — BP 114/82 | HR 58 | Ht 63.0 in | Wt 128.2 lb

## 2015-08-08 DIAGNOSIS — R002 Palpitations: Secondary | ICD-10-CM

## 2015-08-08 NOTE — Patient Instructions (Signed)
Your physician recommends that you schedule a follow-up appointment in: As Needed    

## 2016-06-22 ENCOUNTER — Encounter: Payer: Self-pay | Admitting: *Deleted

## 2016-07-31 ENCOUNTER — Encounter: Payer: Self-pay | Admitting: Internal Medicine

## 2016-09-14 NOTE — Progress Notes (Deleted)
Cardiology Office Note   Date:  09/14/2016   ID:  Connie FiddlerRebecca S Boy, DOB November 09, 1970, MRN [id number]  PCP:  Willow OraAndy, Camille L, MD  Cardiologist:   Rollene RotundaJames Keyonia Gluth, MD   No chief complaint on file.     History of Present Illness: Connie Kirk is a 46 y.o. female who presents for evaluation of an abnormal EKG.  She's had a normal echo with normal bubble study in the past. She has a structurally normal heart except for some mild mitral regurgitation. In 2012 she had a negative POET (Plain Old Exercise Treadmill).  ***   She's never had any symptoms. She's been active although not as much in the last several months that she injured her knee. However, she started doing a little jogging and is getting back to some activity and she's not had any cardiovascular symptoms. She denies any chest pressure, neck or arm discomfort. She's not had any presyncope or syncope. She's had no PND or orthopnea.  Of note she has had some brief episodes of rapid heart rate on two occasions.   She said they were short lived and broke with carotid massage.  One was while exercising and another at rest.  However, she otherwise has not been bothered by these.    Past Medical History:  Diagnosis Date  . Abnormal EKG   . Heart murmur   . Irregular heartbeat     Past Surgical History:  Procedure Laterality Date  . CESAREAN SECTION     sept 2009     Current Outpatient Prescriptions  Medication Sig Dispense Refill  . Calcium Carbonate-Vit D-Min (CALCIUM 1200 PO) Take by mouth daily.      Marland Kitchen. levonorgestrel (MIRENA) 20 MCG/24HR IUD 1 each by Intrauterine route once.    Marland Kitchen. VITAMIN D, CHOLECALCIFEROL, PO Take by mouth daily.       No current facility-administered medications for this visit.     Allergies:   Patient has no known allergies.     ROS:  Please see the history of present illness.   Otherwise, review of systems are positive for ***.   All other systems are reviewed and negative.    PHYSICAL  EXAM: VS:  There were no vitals taken for this visit. , BMI There is no height or weight on file to calculate BMI.  GENERAL:  Well appearing NECK:  No jugular venous distention, waveform within normal limits, carotid upstroke brisk and symmetric, no bruits, no thyromegaly LYMPHATICS:  No cervical, inguinal adenopathy LUNGS:  Clear to auscultation bilaterally CHEST:  Unremarkable HEART:  PMI not displaced or sustained,S1 and S2 within normal limits, no S3, no S4, no clicks, no rubs, *** murmurs ABD:  Flat, positive bowel sounds normal in frequency in pitch, no bruits, no rebound, no guarding, no midline pulsatile mass, no hepatomegaly, no splenomegaly EXT:  2 plus pulses throughout, no edema, no cyanosis no clubbing   GENERAL:  Well appearing NECK:  No jugular venous distention, waveform within normal limits, carotid upstroke brisk and symmetric, no bruits, no thyromegaly LUNGS:  Clear to auscultation bilaterally BACK:  No CVA tenderness CHEST:  Unremarkable HEART:  PMI not displaced or sustained,S1 and S2 within normal limits, no S3, no S4, no clicks, no rubs, no murmurs ABD:  Flat, positive bowel sounds normal in frequency in pitch, no bruits, no rebound, no guarding, no midline pulsatile mass, no hepatomegaly, no splenomegaly EXT:  2 plus pulses throughout, no edema, no cyanosis no clubbing  EKG:  EKG is *** ordered today. The ekg ordered today demonstrates sinus rhythm, rate ***, right bundle branch block, leftward axis, inferior T-wave inversions. This is unchanged from the previous EKG.   Recent Labs: No results found for requested labs within last 8760 hours.    Lipid Panel No results found for: CHOL, TRIG, HDL, CHOLHDL, VLDL, LDLCALC, LDLDIRECT    Wt Readings from Last 3 Encounters:  08/08/15 128 lb 3.2 oz (58.2 kg)  09/01/11 132 lb (59.9 kg)  08/18/11 132 lb (59.9 kg)      Other studies Reviewed: Additional studies/ records that were reviewed today include:  *** Review of the above records demonstrates:   ***  ASSESSMENT AND PLAN:  ABNORMAL EKG:   ***  The patient's EKG is markedly abnormal but has been thoroughly evaluated and she has actually no symptoms related to this. At this point I don't think that further imaging is indicated and we discussed this.  MR:  *** She had mild MR previous echo and I don't appreciate any murmurs. We will follow this clinically.  RISK REDUCTION:   ***  She is going to start exercising and let me know if she has any further palpitations or SOB  SVT:  We discussed possible therapies. ***However, these are so infrequent that she would not want meds.  In addition they are so infrequent that an event monitor is unlikely to demonstrate one of these events.    Current medicines are reviewed at length with the patient today.  The patient does not have concerns regarding medicines.  The following changes have been made:  ***  Labs/ tests ordered today include: *** No orders of the defined types were placed in this encounter.    Disposition:   FU with me ***  Signed, Rollene Rotunda, MD  09/14/2016 8:03 PM    [facility]

## 2016-09-15 ENCOUNTER — Ambulatory Visit: Payer: Self-pay | Admitting: Cardiology

## 2016-10-12 ENCOUNTER — Encounter: Payer: Self-pay | Admitting: *Deleted

## 2016-10-28 NOTE — Progress Notes (Signed)
Cardiology Office Note   Date:  10/31/2016   ID:  Connie Kirk, DOB [birth year], MRN [id number]  PCP:  Willow Ora, MD  Cardiologist:   Rollene Rotunda, MD   Chief Complaint  Patient presents with  . SVT      History of Present Illness: Connie Kirk is a 46 y.o. female who presents for evaluation of an abnormal EKG. I seen her couple of times over the years for this. She's had a normal echo with normal bubble study in the past. She has a structurally normal  heart except for some mild mitral regurgitation. In 2012 she had a negative POET (Plain Old Exercise Treadmill).  She's had 2 episodes of tachypalpitations in the last few months. The first was in April when she was throwing a softball with her children. The second was when she was running on a treadmill by herself. She was able to break out side. She felt her heart going about 200 beats a minute. She said she felt really poorly with this. She didn't have any syncope. She did have some SOB and pain with these.  She's not been as physically active as she usually is that she does her activities during. She's not been bringing on the palpitations.  Past Medical History:  Diagnosis Date  . Abnormal EKG   . Heart murmur   . Irregular heartbeat     Past Surgical History:  Procedure Laterality Date  . CESAREAN SECTION     sept 2009     Current Outpatient Prescriptions  Medication Sig Dispense Refill  . Calcium Carbonate-Vit D-Min (CALCIUM 1200 PO) Take by mouth daily.      Marland Kitchen levonorgestrel (MIRENA) 20 MCG/24HR IUD 1 each by Intrauterine route once.    Marland Kitchen VITAMIN D, CHOLECALCIFEROL, PO Take by mouth daily.       No current facility-administered medications for this visit.     Allergies:   Patient has no known allergies.    ROS:  Please see the history of present illness.   Otherwise, review of systems are positive for none.   All other systems are reviewed and negative.    PHYSICAL EXAM: VS:  BP 122/80 (BP  Location: Left Arm, Patient Position: Sitting, Cuff Size: Normal)   Pulse (!) 56   Ht 5\' 3"  (1.6 m)   Wt 134 lb 12.8 oz (61.1 kg)   BMI 23.88 kg/m  , BMI Body mass index is 23.88 kg/m.  GENERAL:  Well appearing NECK:  No jugular venous distention, waveform within normal limits, carotid upstroke brisk and symmetric, no bruits, no thyromegaly LUNGS:  Clear to auscultation bilaterally BACK:  No CVA tenderness CHEST:  Unremarkable HEART:  PMI not displaced or sustained,S1 and S2 within normal limits, no S3, no S4, no clicks, no rubs, no murmurs ABD:  Flat, positive bowel sounds normal in frequency in pitch, no bruits, no rebound, no guarding, no midline pulsatile mass, no hepatomegaly, no splenomegaly EXT:  2 plus pulses throughout, no edema, no cyanosis no clubbing   EKG:  EKG is  ordered today. The ekg ordered today demonstrates sinus rhythm, rate 56, right bundle branch block, leftward axis, inferior T-wave inversions. This is unchanged from the previous EKG.   Recent Labs: No results found for requested labs within last 8760 hours.    Lipid Panel No results found for: CHOL, TRIG, HDL, CHOLHDL, VLDL, LDLCALC, LDLDIRECT    Wt Readings from Last 3 Encounters:  10/29/16 134 lb 12.8  oz (61.1 kg)  08/08/15 128 lb 3.2 oz (58.2 kg)  09/01/11 132 lb (59.9 kg)      Other studies Reviewed: Additional studies/ records that were reviewed today include:  Labs Review of the above records demonstrates:        ASSESSMENT AND PLAN:  ABNORMAL EKG:   I have discussed her case with Dr. Turner Daniels given the marked abnormality and going to look for structural heart disease with a cardiac MRI.  I will bring the patient back for a POET (Plain Old Exercise Test). This will allow me to screen for obstructive coronary disease, risk stratify and very importantly provide a prescription for exercise.  This will be done because she has had some  SOB and mild chest pain as above.   MR:  She had mild MR on  previous echo.  We will talk about quantifying this further with the MRI.  I don't appreciate any murmurs.  This was mild in the past.   SVT:  I will see if I can induce this with the POET (Plain Old Exercise Treadmill).  I have also asked her to get an Alive Cor so that we can capture these events.     Current medicines are reviewed at length with the patient today.  The patient does not have concerns regarding medicines.  The following changes have been made:  None  Labs/ tests ordered today include:   Orders Placed This Encounter  Procedures  . Exercise Tolerance Test  . EKG 12-Lead     Disposition:   FU with me one year or sooner if needed.       Signed, Rollene Rotunda, MD  10/31/2016 10:29 AM    [facility]

## 2016-10-29 ENCOUNTER — Encounter: Payer: Self-pay | Admitting: Cardiology

## 2016-10-29 ENCOUNTER — Ambulatory Visit (INDEPENDENT_AMBULATORY_CARE_PROVIDER_SITE_OTHER): Payer: BLUE CROSS/BLUE SHIELD | Admitting: Cardiology

## 2016-10-29 VITALS — BP 122/80 | HR 56 | Ht 63.0 in | Wt 134.8 lb

## 2016-10-29 DIAGNOSIS — R0602 Shortness of breath: Secondary | ICD-10-CM

## 2016-10-29 DIAGNOSIS — R079 Chest pain, unspecified: Secondary | ICD-10-CM | POA: Diagnosis not present

## 2016-10-29 DIAGNOSIS — R9431 Abnormal electrocardiogram [ECG] [EKG]: Secondary | ICD-10-CM

## 2016-10-29 DIAGNOSIS — I471 Supraventricular tachycardia: Secondary | ICD-10-CM | POA: Diagnosis not present

## 2016-10-29 NOTE — Patient Instructions (Addendum)
Medication Instructions:  Continue current medications  If you need a refill on your cardiac medications before your next appointment, please call your pharmacy.  Labwork: None Ordered   Testing/Procedures: Your physician has requested that you have an exercise tolerance test. For further information please visit [email or web address] Please also follow instruction sheet, as given.   Follow-Up: Your physician wants you to follow-up in: 1 Year. You should receive a reminder letter in the mail two months in advance. If you do not receive a letter, please call our office [phone]. t    Thank you for choosing [facility] at [facility]!!

## 2016-10-30 ENCOUNTER — Telehealth (HOSPITAL_COMMUNITY): Payer: Self-pay | Admitting: Cardiology

## 2016-10-31 ENCOUNTER — Encounter: Payer: Self-pay | Admitting: Cardiology

## 2016-10-31 DIAGNOSIS — R079 Chest pain, unspecified: Secondary | ICD-10-CM | POA: Insufficient documentation

## 2016-10-31 DIAGNOSIS — R0602 Shortness of breath: Secondary | ICD-10-CM | POA: Insufficient documentation

## 2016-10-31 DIAGNOSIS — I471 Supraventricular tachycardia: Secondary | ICD-10-CM | POA: Insufficient documentation

## 2016-11-02 NOTE — Telephone Encounter (Signed)
User: Trina Ao A Date/time: 10/30/16 3:44 PM  Comment: Called pt and lmsg for her to CB to r/s ETT due to tech being out of the office.   Context:  Outcome: Left Message  Phone number: [phone] Phone Type: Home Phone  Comm. type: Telephone Call type: Outgoing  Contact: Melina Fiddler Relation to patient: Self    User: Trina Ao A Date/time: 10/30/16 9:17 AM  Comment: Called pt and lmsg for her to CB to r/s ETT due to short staffing.   Context:  Outcome: Left Message  Phone number: [phone] Phone Type: Home Phone  Comm. type: Telephone Call type: Outgoing

## 2016-11-05 ENCOUNTER — Telehealth: Payer: Self-pay | Admitting: *Deleted

## 2016-11-05 DIAGNOSIS — R079 Chest pain, unspecified: Secondary | ICD-10-CM

## 2016-11-05 NOTE — Telephone Encounter (Signed)
-----   Message from Rollene RotundaJames Hochrein, MD sent at 11/04/2016  8:50 AM EDT ----- Please work with Dr. Lindaann SloughNelson's nurse.  I talked to Dr. Delton SeeNelson.  We need to order an MRI for Dr. Cleophas DunkerBassett.  Thanks.

## 2016-11-11 ENCOUNTER — Telehealth: Payer: Self-pay | Admitting: Cardiology

## 2016-11-11 NOTE — Telephone Encounter (Signed)
Called and left a VM for the patient to call back regarding time of her cardiac MRI.

## 2016-11-12 ENCOUNTER — Encounter: Payer: Self-pay | Admitting: Cardiology

## 2016-11-17 ENCOUNTER — Encounter: Payer: Self-pay | Admitting: Cardiovascular Disease

## 2016-11-17 ENCOUNTER — Inpatient Hospital Stay (HOSPITAL_COMMUNITY): Admission: RE | Admit: 2016-11-17 | Payer: Self-pay | Source: Ambulatory Visit

## 2016-11-19 ENCOUNTER — Other Ambulatory Visit (HOSPITAL_COMMUNITY): Payer: Self-pay

## 2016-11-19 ENCOUNTER — Telehealth (HOSPITAL_COMMUNITY): Payer: Self-pay

## 2016-11-19 NOTE — Telephone Encounter (Signed)
Encounter complete. 

## 2016-11-24 ENCOUNTER — Ambulatory Visit (HOSPITAL_COMMUNITY)
Admission: RE | Admit: 2016-11-24 | Discharge: 2016-11-24 | Disposition: A | Discharge status: Home / Self Care | Payer: BLUE CROSS/BLUE SHIELD | Source: Ambulatory Visit | Attending: Cardiovascular Disease | Admitting: Cardiovascular Disease

## 2016-11-24 DIAGNOSIS — R0602 Shortness of breath: Secondary | ICD-10-CM | POA: Diagnosis present

## 2016-11-24 DIAGNOSIS — R079 Chest pain, unspecified: Secondary | ICD-10-CM | POA: Insufficient documentation

## 2016-11-24 LAB — EXERCISE TOLERANCE TEST
CHL CUP MPHR: 175 {beats}/min
CHL RATE OF PERCEIVED EXERTION: 16
CSEPEDS: 21 s
CSEPHR: 94 %
Estimated workload: 14 METS
Exercise duration (min): 12 min
Peak HR: 166 {beats}/min
Rest HR: 67 {beats}/min

## 2016-11-26 ENCOUNTER — Ambulatory Visit (HOSPITAL_COMMUNITY)
Admission: RE | Admit: 2016-11-26 | Discharge: 2016-11-26 | Disposition: A | Discharge status: Home / Self Care | Payer: BLUE CROSS/BLUE SHIELD | Source: Ambulatory Visit | Attending: Cardiology | Admitting: Cardiology

## 2016-11-26 DIAGNOSIS — R9431 Abnormal electrocardiogram [ECG] [EKG]: Secondary | ICD-10-CM | POA: Diagnosis not present

## 2016-11-26 DIAGNOSIS — R079 Chest pain, unspecified: Secondary | ICD-10-CM | POA: Insufficient documentation

## 2016-11-26 LAB — CREATININE, SERUM: Creatinine, Ser: 1.08 mg/dL — ABNORMAL HIGH (ref 0.44–1.00)

## 2016-11-26 MED ORDER — GADOBENATE DIMEGLUMINE 529 MG/ML IV SOLN
20.0000 mL | Freq: Once | INTRAVENOUS | Status: AC | PRN
  Administered 2016-11-26: 20 mL via INTRAVENOUS

## 2017-02-07 NOTE — Progress Notes (Deleted)
Cardiology Office Note   Date:  02/07/2017   ID:  Connie Kirk, DOB [birth year], MRN [id number]  PCP:  Willow OraAndy, Camille L, MD  Cardiologist:   Rollene RotundaJames Salome Cozby, MD   No chief complaint on file.     History of Present Illness: Connie Kirk is a 46 y.o. female who presents for evaluation of an abnormal EKG. I seen her couple of times over the years for this. She's had a normal echo with normal bubble study in the past. She has a structurally normal  heart except for some mild mitral regurgitation. In 2012 she had a negative POET (Plain Old Exercise Treadmill).  She's had 2 episodes of tachypalpitations in the last prior to the last visit. I saw her and she again had a normal POET (Plain Old Exercise Treadmill).  I sent her for a cardiac MRI.   This was read as a small area of gadolinium uptake and that she might consider testing for dilated CM.  However, I reviewed these images with Dr. Delton SeeNelson and this was not thought to be a significantly abnormal finding.   She returns for follow up.  ***    months. The first was in April when she was throwing a softball with her children. The second was when she was running on a treadmill by herself. She was able to break out side. She felt her heart going about 200 beats a minute. She said she felt really poorly with this. She didn't have any syncope. She did have some SOB and pain with these.  She's not been as physically active as she usually is that she does her activities during. She's not been bringing on the palpitations.  Past Medical History:  Diagnosis Date  . Abnormal EKG   . Heart murmur   . Irregular heartbeat     Past Surgical History:  Procedure Laterality Date  . CESAREAN SECTION     sept 2009     Current Outpatient Medications  Medication Sig Dispense Refill  . Calcium Carbonate-Vit D-Min (CALCIUM 1200 PO) Take by mouth daily.      Marland Kitchen. levonorgestrel (MIRENA) 20 MCG/24HR IUD 1 each by Intrauterine route once.    Marland Kitchen. VITAMIN  D, CHOLECALCIFEROL, PO Take by mouth daily.       No current facility-administered medications for this visit.     Allergies:   Patient has no known allergies.    ROS:  Please see the history of present illness.   Otherwise, review of systems are positive for ***.   All other systems are reviewed and negative.    PHYSICAL EXAM: VS:  There were no vitals taken for this visit. , BMI There is no height or weight on file to calculate BMI.  GENERAL:  Well appearing NECK:  No jugular venous distention, waveform within normal limits, carotid upstroke brisk and symmetric, no bruits, no thyromegaly LUNGS:  Clear to auscultation bilaterally CHEST:  Unremarkable HEART:  PMI not displaced or sustained,S1 and S2 within normal limits, no S3, no S4, no clicks, no rubs, *** murmurs ABD:  Flat, positive bowel sounds normal in frequency in pitch, no bruits, no rebound, no guarding, no midline pulsatile mass, no hepatomegaly, no splenomegaly EXT:  2 plus pulses throughout, no edema, no cyanosis no clubbing   GENERAL:  Well appearing NECK:  No jugular venous distention, waveform within normal limits, carotid upstroke brisk and symmetric, no bruits, no thyromegaly LUNGS:  Clear to auscultation bilaterally BACK:  No CVA  tenderness CHEST:  Unremarkable HEART:  PMI not displaced or sustained,S1 and S2 within normal limits, no S3, no S4, no clicks, no rubs, no murmurs ABD:  Flat, positive bowel sounds normal in frequency in pitch, no bruits, no rebound, no guarding, no midline pulsatile mass, no hepatomegaly, no splenomegaly EXT:  2 plus pulses throughout, no edema, no cyanosis no clubbing   EKG:  EKG is *** ordered today. The ekg ordered today demonstrates sinus rhythm, rate ***, right bundle branch block, leftward axis, inferior T-wave inversions. This is unchanged from the previous EKG.   Recent Labs: 11/26/2016: Creatinine, Ser 1.08    Lipid Panel No results found for: CHOL, TRIG, HDL, CHOLHDL,  VLDL, LDLCALC, LDLDIRECT    Wt Readings from Last 3 Encounters:  10/29/16 134 lb 12.8 oz (61.1 kg)  08/08/15 128 lb 3.2 oz (58.2 kg)  09/01/11 132 lb (59.9 kg)      Other studies Reviewed: Additional studies/ records that were reviewed today include:  *** Review of the above records demonstrates:     ***   ASSESSMENT AND PLAN:  ABNORMAL EKG:    Results of the POET (Plain Old Exercise Treadmill) and MRI are as above.  ***   I have discussed her case with Dr. Turner DanielsKlei given the marked abnormality and going to look for structural heart disease with a cardiac MRI.  I will bring the patient back for a POET (Plain Old Exercise Test). This will allow me to screen for obstructive coronary disease, risk stratify and very importantly provide a prescription for exercise.  This will be done because she has had some  SOB and mild chest pain as above.   MR:  This was trivial MR on MRI.  ***  She had mild MR on previous echo.  We will talk about quantifying this further with the MRI.  I don't appreciate any murmurs.  This was mild in the past.   SVT:  *** I will see if I can induce this with the POET (Plain Old Exercise Treadmill).  I have also asked her to get an Alive Cor so that we can capture these events.     Current medicines are reviewed at length with the patient today.  The patient does not have concerns regarding medicines.  The following changes have been made:  ***  Labs/ tests ordered today include:  ***  No orders of the defined types were placed in this encounter.    Disposition:   FU with me ***      Signed, Rollene RotundaJames Seriah Brotzman, MD  02/07/2017 10:34 PM    [facility]

## 2017-02-09 ENCOUNTER — Ambulatory Visit: Payer: BLUE CROSS/BLUE SHIELD | Admitting: Cardiology

## 2017-03-16 ENCOUNTER — Encounter: Payer: Self-pay | Admitting: Internal Medicine

## 2017-05-11 ENCOUNTER — Encounter: Payer: Self-pay | Admitting: Internal Medicine

## 2017-07-06 ENCOUNTER — Encounter: Payer: Self-pay | Admitting: Internal Medicine

## 2017-12-28 IMAGING — MR MR CARD MORPHOLOGY WO/W CM
14 of 15 series · 39 of 40 positions shown · IV contrast (20    MH)
Comparison: none

CLINICAL DATA: Abnormal ECG

EXAM:
CARDIAC MRI
TECHNIQUE: The patient was scanned on a 1.5 Tesla GE magnet. A dedicated
cardiac coil was used. Functional imaging was done using Fiesta
sequences. [DATE], and 4 chamber views were done to assess for RWMA's.
Modified Nya rule using a short axis stack was used to
calculate an ejection fraction on a dedicated work station using
Circle software. The patient received 20 cc of Multihance. After 10
minutes inversion recovery sequences were used to assess for
infiltration and scar tissue.
CONTRAST:  20 cc Multihance

[Series 3: bSSFP · sagittal · 8.0mm · 1.21mm/px · 1 of 14 slices shown (1 of 6)]
[im 1/14]
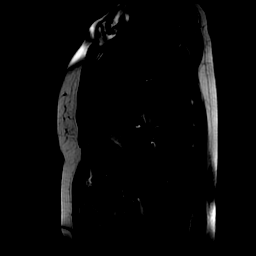

[Series 4: bSSFP · oblique · 8.0mm · 1.37mm/px · 1 of 20 slices shown (2 of 6)]
[im 1/20]
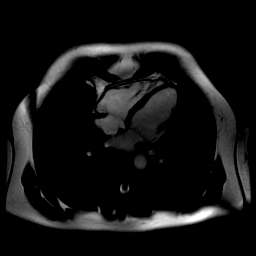

[Series 5: bSSFP · oblique · 8.0mm · 1.25mm/px · 11 of 280 slices shown (3 of 6)]
[im 1/280]
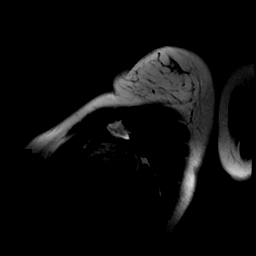
[im 28/280]
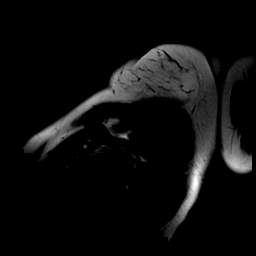
[im 56/280]
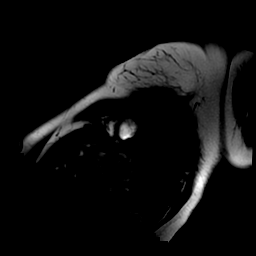
[im 84/280]
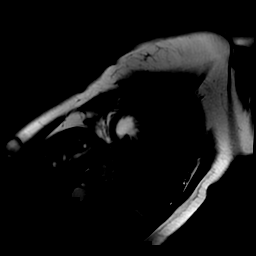
[im 112/280]
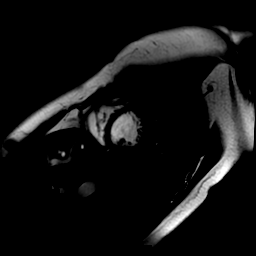
[im 140/280]
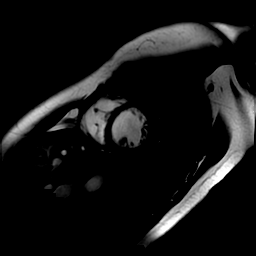
[im 168/280]
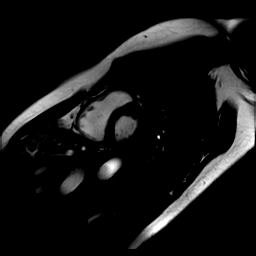
[im 196/280]
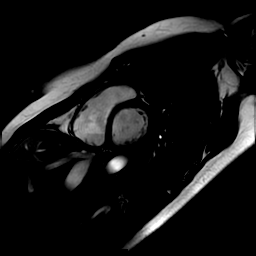
[im 224/280]
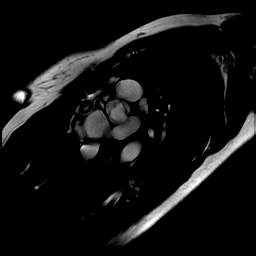
[im 252/280]
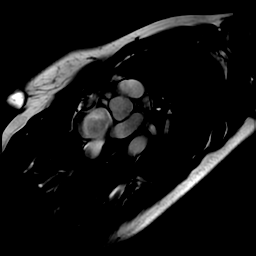
[im 280/280]
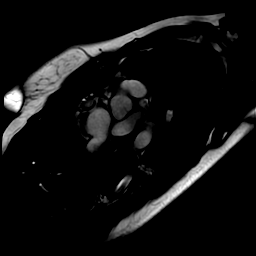

[Series 8: qpqs (id) · axial · 8.0mm · 1.48mm/px · z∈[+3,+3]mm · 2 of 60 slices shown]
[im 1/60]
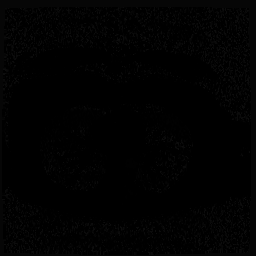
[im 60/60]
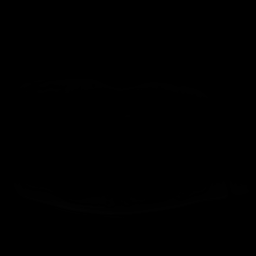

[Series 9: bSSFP · oblique · 8.0mm · 1.37mm/px · 5 of 140 slices shown (4 of 6)]
[im 1/140]
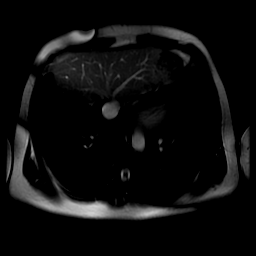
[im 35/140]
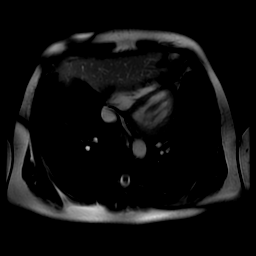
[im 70/140]
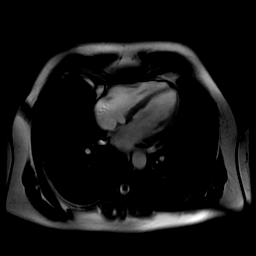
[im 105/140]
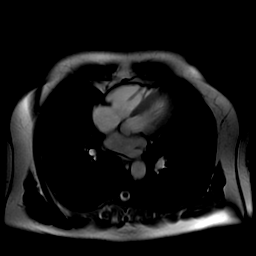
[im 140/140]
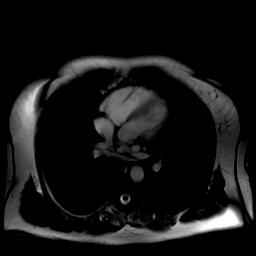

[Series 10: bSSFP · oblique · 8.0mm · 1.29mm/px · 2 of 60 slices shown (5 of 6)]
[im 1/60]
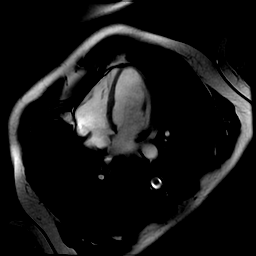
[im 60/60]
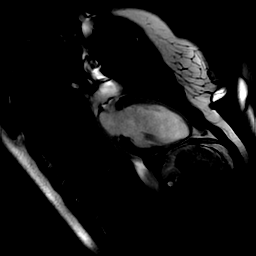

[Series 11: mitral valve · oblique · 8.0mm · 1.48mm/px · 2 of 60 slices shown]
[im 1/60]
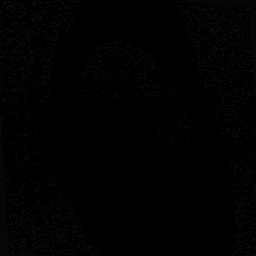
[im 60/60]
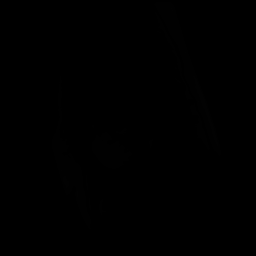

[Series 16: delayed ir prep · oblique · 8.0mm · 1.33mm/px · 1 of 3 slices shown (1 of 3)]
[im 1/3]
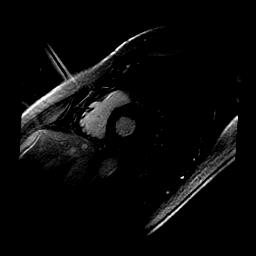

[Series 17: delayed ir prep · oblique · 8.0mm · 1.33mm/px · 1 of 6 slices shown (2 of 3)]
[im 1/6]
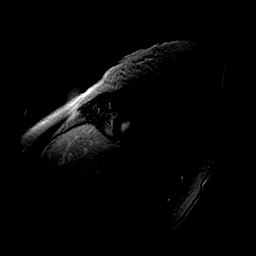

[Series 18: delayed ir prep · oblique · 8.0mm · 1.33mm/px · 1 of 4 slices shown (3 of 3)]
[im 1/4]
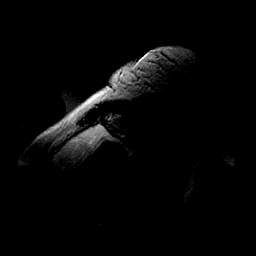

[Series 19: rad mde · oblique · 8.0mm · 1.29mm/px · 1 of 3 slices shown]
[im 1/3]
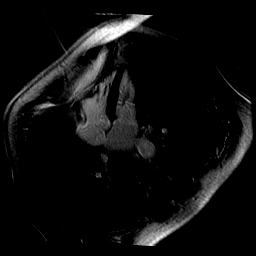

[Series 20: T1 · axial · 6.0mm · 0.90mm/px · 1 of 12 slices shown (1 of 2)]
[im 1/12]
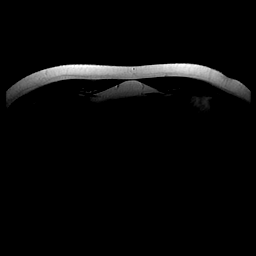

[Series 22: T1 · axial · 6.0mm · 0.90mm/px · 1 of 12 slices shown (2 of 2)]
[im 1/12]
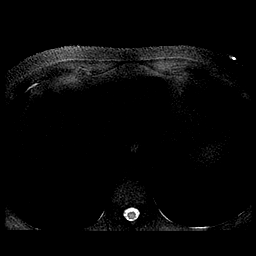

[Series 23: bSSFP · axial · 6.0mm · 1.13mm/px · z∈[-62,+4]mm · 9 of 240 slices shown (6 of 6)]
[im 1/240]
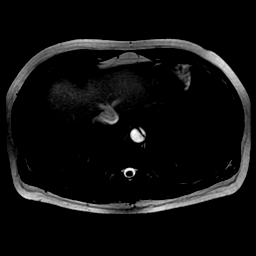
[im 30/240]
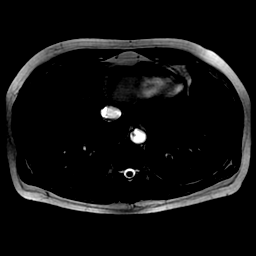
[im 60/240]
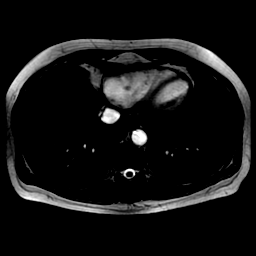
[im 90/240]
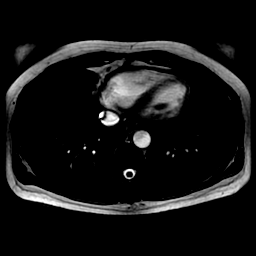
[im 120/240]
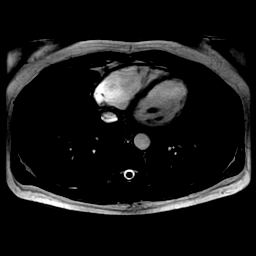
[im 150/240]
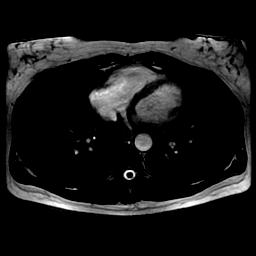
[im 180/240]
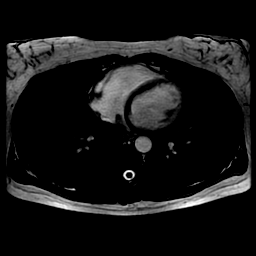
[im 210/240]
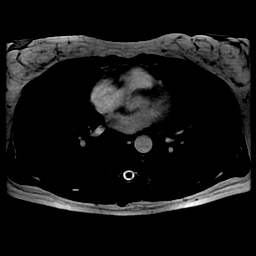
[im 240/240]
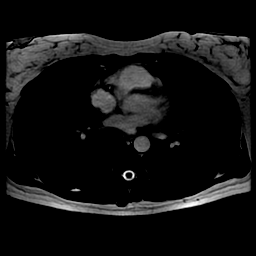

[39 of 40 positions shown; findings below may reference images not displayed]

FINDINGS: The LA/RA were normal in size and function The RV was normal in size
and function with no evidence of RVD. The LV was normal in size and
function Septal thickness 9 mm. The quantitative EF was

67% (EDV 104 cc ESV 34 cc SV 70 cc) The aortic valve was normal and
tri leaflet The aortic root was normal. The mitral valve was normal
with trivial appearing MR. Delayed enhancement showed a thin

Layer of mid myocardial uptake in the septum and anterior wall
IMPRESSION: 1) Normal LV size and function no LVH EF 67%

2) Mild/ small area of mid myocardial gadolinium uptake in septum
and anterior wall Consider genetic testing for infiltrative DCM
given abnormal ECG

3) Trivial MR

4) Normal RV size and function no ROSEBUD

Mirismayil Bor

## 2018-04-22 ENCOUNTER — Ambulatory Visit (INDEPENDENT_AMBULATORY_CARE_PROVIDER_SITE_OTHER): Payer: PRIVATE HEALTH INSURANCE | Admitting: Family Medicine

## 2018-04-22 ENCOUNTER — Encounter: Payer: Self-pay | Admitting: Family Medicine

## 2018-04-22 VITALS — BP 118/74 | HR 60 | Temp 98.4°F | Ht 63.0 in | Wt 147.2 lb

## 2018-04-22 DIAGNOSIS — Z131 Encounter for screening for diabetes mellitus: Secondary | ICD-10-CM

## 2018-04-22 DIAGNOSIS — Z13220 Encounter for screening for lipoid disorders: Secondary | ICD-10-CM

## 2018-04-22 DIAGNOSIS — R7989 Other specified abnormal findings of blood chemistry: Secondary | ICD-10-CM | POA: Insufficient documentation

## 2018-04-22 DIAGNOSIS — Z0001 Encounter for general adult medical examination with abnormal findings: Secondary | ICD-10-CM | POA: Diagnosis not present

## 2018-04-22 DIAGNOSIS — Z6826 Body mass index (BMI) 26.0-26.9, adult: Secondary | ICD-10-CM

## 2018-04-22 DIAGNOSIS — I471 Supraventricular tachycardia: Secondary | ICD-10-CM

## 2018-04-22 DIAGNOSIS — Z23 Encounter for immunization: Secondary | ICD-10-CM

## 2018-04-22 LAB — LIPID PANEL
CHOL/HDL RATIO: 3
Cholesterol: 201 mg/dL — ABNORMAL HIGH (ref 0–200)
HDL: 59.2 mg/dL (ref >39.00)
LDL Cholesterol: 127 mg/dL — ABNORMAL HIGH (ref 0–99)
NONHDL: 141.68
Triglycerides: 72 mg/dL (ref 0.0–149.0)
VLDL: 14.4 mg/dL (ref 0.0–40.0)

## 2018-04-22 LAB — COMPREHENSIVE METABOLIC PANEL
ALBUMIN: 4.7 g/dL (ref 3.5–5.2)
ALK PHOS: 70 U/L (ref 39–117)
ALT: 30 U/L (ref 0–35)
AST: 26 U/L (ref 0–37)
BUN: 14 mg/dL (ref 6–23)
CHLORIDE: 105 meq/L (ref 96–112)
CO2: 28 mEq/L (ref 19–32)
CREATININE: 0.8 mg/dL (ref 0.40–1.20)
Calcium: 9.6 mg/dL (ref 8.4–10.5)
GFR: 76.98 mL/min (ref >60.00)
Glucose, Bld: 81 mg/dL (ref 70–99)
Potassium: 3.8 mEq/L (ref 3.5–5.1)
SODIUM: 143 meq/L (ref 135–145)
TOTAL PROTEIN: 7.3 g/dL (ref 6.0–8.3)
Total Bilirubin: 0.5 mg/dL (ref 0.2–1.2)

## 2018-04-22 LAB — CBC
HCT: 39.4 % (ref 36.0–46.0)
Hemoglobin: 13.3 g/dL (ref 12.0–15.0)
MCHC: 33.7 g/dL (ref 30.0–36.0)
MCV: 91.2 fl (ref 78.0–100.0)
PLATELETS: 225 10*3/uL (ref 150.0–400.0)
RBC: 4.32 Mil/uL (ref 3.87–5.11)
RDW: 13.4 % (ref 11.5–15.5)
WBC: 6.5 10*3/uL (ref 4.0–10.5)

## 2018-04-22 LAB — HEMOGLOBIN A1C: HEMOGLOBIN A1C: 6 % (ref 4.6–6.5)

## 2018-04-22 LAB — VITAMIN D 25 HYDROXY (VIT D DEFICIENCY, FRACTURES): VITD: 46.6 ng/mL (ref 30.00–100.00)

## 2018-04-22 LAB — TSH: TSH: 2.74 u[IU]/mL (ref 0.35–4.50)

## 2018-04-22 MED ORDER — TETANUS-DIPHTH-ACELL PERTUSSIS 5-2.5-18.5 LF-MCG/0.5 IM SUSP
0.5000 mL | Freq: Once | INTRAMUSCULAR | Status: AC
  Administered 2018-04-22: 0.5 mL via INTRAMUSCULAR

## 2018-04-22 NOTE — Progress Notes (Signed)
Subjective:  Connie Kirk is a 48 y.o. female who presents today for her annual comprehensive physical exam and to establish care.    HPI:  She has no acute complaints today.   Her stable, chronic medical conditions are outlined below:  # Low Vitamin D - On OTC supplementation  % SVT / Abnormal EKG - Follows with cardiology - Dr Percival Spanish  % IUD in [place] with OBGYN  Lifestyle Diet: No specific diets or eating plans.  Exercise: No routine exercise.   Depression screen PHQ 2/9 04/22/2018  Decreased Interest 0  Down, Depressed, Hopeless 0  PHQ - 2 Score 0   Health Maintenance Due  Topic Date Due  . HIV Screening  08/05/1986  . TETANUS/TDAP  08/05/1990  . COLONOSCOPY  08/31/2016    ROS: Per HPI, otherwise a complete review of systems was negative.   PMH:  The following were reviewed and entered/updated in epic: Past Medical History:  Diagnosis Date  . Abnormal EKG   . Heart murmur   . Irregular heartbeat   . Mitral regurgitation 08/18/2010   Patient Active Problem List   Diagnosis Date Noted  . Low vitamin D level 04/22/2018  . SVT (supraventricular tachycardia) ([place]) 10/31/2016  . Abnormal EKG 08/18/2010   Past Surgical History:  Procedure Laterality Date  . CESAREAN SECTION     sept 2009    Family History  Problem Relation Age of Onset  . Abnormal EKG Father   . Arthritis Father   . Hyperlipidemia Father   . Colon cancer Father   . Prostate cancer Father   . Alcohol abuse Mother   . Arthritis Mother   . Diabetes Other        family hx of  . Hypertension Other        family hx of  . Coronary artery disease Other        family hx of  . Heart disease Maternal Grandmother   . Diabetes Maternal Grandmother   . Breast cancer Paternal Grandmother        breast   . Heart disease Paternal Grandfather   . Sudden death Paternal Grandfather 32    Medications- reviewed and updated Current Outpatient Medications  Medication Sig  Dispense Refill  . Calcium Carbonate-Vit D-Min (CALCIUM 1200 PO) Take by mouth daily.      . Cholecalciferol (VITAMIN D-3) 25 MCG (1000 UT) CAPS Take by mouth.    . levonorgestrel (MIRENA) 20 MCG/24HR IUD 1 each by Intrauterine route once.     Current Facility-Administered Medications  Medication Dose Route Frequency Provider Last Rate Last Dose  . Tdap (BOOSTRIX) injection 0.5 mL  0.5 mL Intramuscular Once Vivi Barrack, MD        Allergies-reviewed and updated No Known Allergies  Social History   Socioeconomic History  . Marital status: Married    Spouse name: Not on file  . Number of children: 3  . Years of education: Not on file  . Highest education level: Not on file  Occupational History  . Occupation: [employer]  . Financial resource strain: Not on file  . Food insecurity:    Worry: Not on file    Inability: Not on file  . Transportation needs:    Medical: Not on file    Non-medical: Not on file  Tobacco Use  . Smoking status: Never Smoker  . Smokeless tobacco: Never Used  Substance and Sexual Activity  .  Alcohol use: Yes    Comment: occ  . Drug use: No  . Sexual activity: Not on file  Lifestyle  . Physical activity:    Days per week: Not on file    Minutes per session: Not on file  . Stress: Not on file  Relationships  . Social connections:    Talks on phone: Not on file    Gets together: Not on file    Attends religious service: Not on file    Active member of club or organization: Not on file    Attends meetings of clubs or organizations: Not on file    Relationship status: Not on file  Other Topics Concern  . Not on file  Social History Narrative  . Not on file    Objective:  Physical Exam: BP 118/74 (BP Location: Left Arm, Patient Position: Sitting, Cuff Size: Normal)   Pulse 60   Temp 98.4 F (36.9 C) (Oral)   Ht _0  (1.6 m)   Wt 147 lb 3.2 oz (66.8 kg)   SpO2 99%   BMI 26.08 kg/m   Body mass index is 26.08  kg/m. Wt Readings from Last 3 Encounters:  04/22/18 147 lb 3.2 oz (66.8 kg)  10/29/16 134 lb 12.8 oz (61.1 kg)  08/08/15 128 lb 3.2 oz (58.2 kg)   Gen: NAD, resting comfortably HEENT: TMs normal bilaterally. OP clear. No thyromegaly noted.  CV: RRR with no murmurs appreciated Pulm: NWOB, CTAB with no crackles, wheezes, or rhonchi GI: Normal bowel sounds present. Soft, Nontender, Nondistended. MSK: no edema, cyanosis, or clubbing noted Skin: warm, dry Neuro: CN2-12 grossly intact. Strength 5/5 in upper and lower extremities. Reflexes symmetric and intact bilaterally.  Psych: Normal affect and thought content  Assessment/Plan:  SVT (supraventricular tachycardia) (HCC) Stable.  Continue management per cardiology.  Check CBC, C met, and TSH.  Low vitamin D level Check vitamin D level.  BMI 26 Discussed importance of regular exercise and healthy diet.  Preventative Healthcare: Tdap given today.  Check lipid panel.  Check A1c to screen for diabetes.  Recommended that she schedule appointment for repeat colonoscopy.  Patient Counseling(The following topics were reviewed and/or handout was given):  -Nutrition: Stressed importance of moderation in sodium/caffeine intake, saturated fat and cholesterol, caloric balance, sufficient intake of fresh fruits, vegetables, and fiber.  -Stressed the importance of regular exercise.   -Substance Abuse: Discussed cessation/primary prevention of tobacco, alcohol, or other drug use; driving or other dangerous activities under the influence; availability of treatment for abuse.   -Injury prevention: Discussed safety belts, safety helmets, smoke detector, smoking near bedding or upholstery.   -Sexuality: Discussed sexually transmitted diseases, partner selection, use of condoms, avoidance of unintended pregnancy and contraceptive alternatives.   -Dental health: Discussed importance of regular tooth brushing, flossing, and dental visits.  -Health  maintenance and immunizations reviewed. Please refer to Health maintenance section.  Return to care in 1 year for next preventative visit.   Algis Greenhouse. Jerline Pain, MD 04/22/2018 10:04 AM

## 2018-04-22 NOTE — Patient Instructions (Signed)
It was very nice to see you today!  We will check blood work today.  We will give you your tetanus shot.  Please schedule your  colonoscopy soon.  Take care, Dr Jerline Pain   Preventive Care 40-64 Years, Female Preventive care refers to lifestyle choices and visits with your health care provider that can promote health and wellness. What does preventive care include?   A yearly physical exam. This is also called an annual well check.  Dental exams once or twice a year.  Routine eye exams. Ask your health care provider how often you should have your eyes checked.  Personal lifestyle choices, including: ? Daily care of your teeth and gums. ? Regular physical activity. ? Eating a healthy diet. ? Avoiding tobacco and drug use. ? Limiting alcohol use. ? Practicing safe sex. ? Taking low-dose aspirin daily starting at age 103. ? Taking vitamin and mineral supplements as recommended by your health care provider. What happens during an annual well check? The services and screenings done by your health care provider during your annual well check will depend on your age, overall health, lifestyle risk factors, and family history of disease. Counseling Your health care provider may ask you questions about your:  Alcohol use.  Tobacco use.  Drug use.  Emotional well-being.  Home and relationship well-being.  Sexual activity.  Eating habits.  Work and work Statistician.  Method of birth control.  Menstrual cycle.  Pregnancy history. Screening You may have the following tests or measurements:  Height, weight, and BMI.  Blood pressure.  Lipid and cholesterol levels. These may be checked every 5 years, or more frequently if you are over 6 years old.  Skin check.  Lung cancer screening. You may have this screening every year starting at age 57 if you have a 30-pack-year history of smoking and currently smoke or have quit within the past 15 years.  Colorectal cancer  screening. All adults should have this screening starting at age 61 and continuing until age 20. Your health care provider may recommend screening at age 38. You will have tests every 1-10 years, depending on your results and the type of screening test. People at increased risk should start screening at an earlier age. Screening tests may include: ? Guaiac-based fecal occult blood testing. ? Fecal immunochemical test (FIT). ? Stool DNA test. ? Virtual colonoscopy. ? Sigmoidoscopy. During this test, a flexible tube with a tiny camera (sigmoidoscope) is used to examine your rectum and lower colon. The sigmoidoscope is inserted through your anus into your rectum and lower colon. ? Colonoscopy. During this test, a long, thin, flexible tube with a tiny camera (colonoscope) is used to examine your entire colon and rectum.  Hepatitis C blood test.  Hepatitis B blood test.  Sexually transmitted disease (STD) testing.  Diabetes screening. This is done by checking your blood sugar (glucose) after you have not eaten for a while (fasting). You may have this done every 1-3 years.  Mammogram. This may be done every 1-2 years. Talk to your health care provider about when you should start having regular mammograms. This may depend on whether you have a family history of breast cancer.  BRCA-related cancer screening. This may be done if you have a family history of breast, ovarian, tubal, or peritoneal cancers.  Pelvic exam and Pap test. This may be done every 3 years starting at age 34. Starting at age 86, this may be done every 5 years if you have a Pap  test in combination with an HPV test.  Bone density scan. This is done to screen for osteoporosis. You may have this scan if you are at high risk for osteoporosis. Discuss your test results, treatment options, and if necessary, the need for more tests with your health care provider. Vaccines Your health care provider may recommend certain vaccines, such  as:  Influenza vaccine. This is recommended every year.  Tetanus, diphtheria, and acellular pertussis (Tdap, Td) vaccine. You may need a Td booster every 10 years.  Varicella vaccine. You may need this if you have not been vaccinated.  Zoster vaccine. You may need this after age 82.  Measles, mumps, and rubella (MMR) vaccine. You may need at least one dose of MMR if you were born in 1957 or later. You may also need a second dose.  Pneumococcal 13-valent conjugate (PCV13) vaccine. You may need this if you have certain conditions and were not previously vaccinated.  Pneumococcal polysaccharide (PPSV23) vaccine. You may need one or two doses if you smoke cigarettes or if you have certain conditions.  Meningococcal vaccine. You may need this if you have certain conditions.  Hepatitis A vaccine. You may need this if you have certain conditions or if you travel or work in places where you may be exposed to hepatitis A.  Hepatitis B vaccine. You may need this if you have certain conditions or if you travel or work in places where you may be exposed to hepatitis B.  Haemophilus influenzae type b (Hib) vaccine. You may need this if you have certain conditions. Talk to your health care provider about which screenings and vaccines you need and how often you need them. This information is not intended to replace advice given to you by your health care provider. Make sure you discuss any questions you have with your health care provider. Document Released: 03/22/2015 Document Revised: 04/15/2017 Document Reviewed: 12/25/2014 Elsevier Interactive Patient Education  2019 [employer].

## 2018-04-22 NOTE — Assessment & Plan Note (Signed)
Stable.  Continue management per cardiology.  Check CBC, C met, and TSH.

## 2018-04-22 NOTE — Assessment & Plan Note (Signed)
Check vitamin D level 

## 2018-04-25 ENCOUNTER — Encounter: Payer: Self-pay | Admitting: Family Medicine

## 2018-04-25 DIAGNOSIS — E785 Hyperlipidemia, unspecified: Secondary | ICD-10-CM | POA: Insufficient documentation

## 2018-04-25 DIAGNOSIS — R739 Hyperglycemia, unspecified: Secondary | ICD-10-CM | POA: Insufficient documentation

## 2018-04-25 NOTE — Progress Notes (Signed)
Please inform patient of the following:  Blood sugar is slightly elevated and in the prediabetic range. Her A1c is 6.0. Recommend starting metformin XR 750mg  daily to improve numbers, help with weight loss, and lower risk of heart attack/stroke.  Please send in if patient is willing to take.  Regardless, she should work on diet and exercise and we can recheck in 6 to 12 months.  Cholesterol levels are borderline-no need for medications, but should work on diet and exercise and we can recheck in a year.  All of her other blood work is normal.  Katina Degree. Jimmey Ralph, MD 04/25/2018 12:08 PM

## 2018-05-06 ENCOUNTER — Encounter: Payer: Self-pay | Admitting: Family Medicine

## 2020-04-09 LAB — HM MAMMOGRAPHY

## 2020-04-11 ENCOUNTER — Encounter: Payer: Self-pay | Admitting: Family Medicine

## 2020-04-11 ENCOUNTER — Other Ambulatory Visit: Payer: Self-pay | Admitting: *Deleted

## 2020-07-23 ENCOUNTER — Other Ambulatory Visit: Payer: Self-pay

## 2020-07-23 ENCOUNTER — Encounter: Payer: Self-pay | Admitting: Family Medicine

## 2020-07-23 ENCOUNTER — Ambulatory Visit (INDEPENDENT_AMBULATORY_CARE_PROVIDER_SITE_OTHER): Payer: PRIVATE HEALTH INSURANCE | Admitting: Family Medicine

## 2020-07-23 VITALS — BP 118/73 | HR 59 | Temp 98.0°F | Ht 63.0 in | Wt 131.8 lb

## 2020-07-23 DIAGNOSIS — E785 Hyperlipidemia, unspecified: Secondary | ICD-10-CM | POA: Diagnosis not present

## 2020-07-23 DIAGNOSIS — Z0001 Encounter for general adult medical examination with abnormal findings: Secondary | ICD-10-CM | POA: Diagnosis not present

## 2020-07-23 DIAGNOSIS — R739 Hyperglycemia, unspecified: Secondary | ICD-10-CM | POA: Diagnosis not present

## 2020-07-23 DIAGNOSIS — R7989 Other specified abnormal findings of blood chemistry: Secondary | ICD-10-CM

## 2020-07-23 NOTE — Assessment & Plan Note (Signed)
Check A1c. 

## 2020-07-23 NOTE — Progress Notes (Signed)
Chief Complaint:  Connie Kirk is a 50 y.o. female who presents today for her annual comprehensive physical exam.    Assessment/Plan:  New/Acute Problems: Tick bite Reasonable to start doxycycline prophylaxis.  [facility]  Chronic Problems Addressed Today: Dyslipidemia Check lipids.  She will follow-up with cardiology soon.  May consider cardiac calcium scan.  Hyperglycemia Check A1c.  Low vitamin D level Check vitamin D level.  Preventative Healthcare: Congratulated patient on weight loss.  We will check labs today.  She will be due for Pap smear and colonoscopy later this year.  Patient Counseling(The following topics were reviewed and/or handout was given):  -Nutrition: Stressed importance of moderation in sodium/caffeine intake, saturated fat and cholesterol, caloric balance, sufficient intake of fresh fruits, vegetables, and fiber.  -Stressed the importance of regular exercise.   -Substance Abuse: Discussed cessation/primary prevention of tobacco, alcohol, or other drug use; driving or other dangerous activities under the influence; availability of treatment for abuse.   -Injury prevention: Discussed safety belts, safety helmets, smoke detector, smoking near bedding or upholstery.   -Sexuality: Discussed sexually transmitted diseases, partner selection, use of condoms, avoidance of unintended pregnancy and contraceptive alternatives.   -Dental health: Discussed importance of regular tooth brushing, flossing, and dental visits.  -Health maintenance and immunizations reviewed. Please refer to Health maintenance section.  Return to care in 1 year for next preventative visit.     Subjective:  HPI:  She has no acute complaints today.   Recently removed the tick.  Tick was engorged.  [place] tick.  Has had some redness to the area.  He had a PA at her office prescribed doxycycline.  She has not yet started.  Lifestyle Diet: Balanced.   Exercise: Likes walking.   Depression screen PHQ 2/9 07/23/2020  Decreased Interest 0  Down, Depressed, Hopeless 0  PHQ - 2 Score 0    Health Maintenance Due  Topic Date Due  . COVID-19 Vaccine (1) Never done  . HIV Screening  Never done  . Hepatitis C Screening  Never done  . COLONOSCOPY (Pts 45-11yrs Insurance coverage will need to be confirmed)  08/31/2016     ROS: Per HPI, otherwise a complete review of systems was negative.   PMH:  The following were reviewed and entered/updated in epic: Past Medical History:  Diagnosis Date  . Abnormal EKG   . Heart murmur   . Irregular heartbeat   . Mitral regurgitation 08/18/2010   Patient Active Problem List   Diagnosis Date Noted  . Hyperglycemia 04/25/2018  . Dyslipidemia 04/25/2018  . Low vitamin D level 04/22/2018  . SVT (supraventricular tachycardia) (HCC) 10/31/2016  . Abnormal EKG 08/18/2010   Past Surgical History:  Procedure Laterality Date  . CESAREAN SECTION     sept 2009    Family History  Problem Relation Age of Onset  . Abnormal EKG Father   . Arthritis Father   . Hyperlipidemia Father   . Colon cancer Father   . Prostate cancer Father   . Alcohol abuse Mother   . Arthritis Mother   . Diabetes Other        family hx of  . Hypertension Other        family hx of  . Coronary artery disease Other        family hx of  . Heart disease Maternal Grandmother   . Diabetes Maternal Grandmother   . Breast cancer Paternal Grandmother  breast   . Heart disease Paternal Grandfather   . Sudden death Paternal Grandfather 41    Medications- reviewed and updated Current Outpatient Medications  Medication Sig Dispense Refill  . Calcium Carbonate-Vit D-Min (CALCIUM 1200 PO) Take by mouth daily.    . Cholecalciferol (VITAMIN D-3) 25 MCG (1000 UT) CAPS Take by mouth.    . doxycycline (VIBRA-TABS) 100 MG tablet Take 100 mg by mouth 2 (two) times daily.    Marland Kitchen levonorgestrel (MIRENA) 20 MCG/24HR IUD by  Intrauterine route.     No current facility-administered medications for this visit.    Allergies-reviewed and updated No Known Allergies  Social History   Socioeconomic History  . Marital status: Married    Spouse name: Not on file  . Number of children: 3  . Years of education: Not on file  . Highest education level: Not on file  Occupational History  . 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 Smoking status: Never Smoker  . Smokeless tobacco: Never Used  Vaping Use  . Vaping Use: Never used  Substance and Sexual Activity  . Alcohol use: Yes    Comment: occ  . Drug use: No  . Sexual activity: Not on file  Other Topics Concern  . Not on file  Social History Narrative  . Not on file   Social Determinants of Health   Financial Resource Strain: Not on file  Food Insecurity: Not on file  Transportation Needs: Not on file  Physical Activity: Not on file  Stress: Not on file  Social Connections: Not on file        Objective:  Physical Exam: BP 118/73   Pulse (!) 59   Temp 98 F (36.7 C) (Temporal)   Ht 5\' 3"  (1.6 m)   Wt 131 lb 12.8 oz (59.8 kg)   SpO2 100%   BMI 23.35 kg/m   Body mass index is 23.35 kg/m. Wt Readings from Last 3 Encounters:  07/23/20 131 lb 12.8 oz (59.8 kg)  04/22/18 147 lb 3.2 oz (66.8 kg)  10/29/16 134 lb 12.8 oz (61.1 kg)   Gen: NAD, resting comfortably HEENT: TMs normal bilaterally. OP clear. No thyromegaly noted.  CV: RRR with no murmurs appreciated Pulm: NWOB, CTAB with no crackles, wheezes, or rhonchi GI: Normal bowel sounds present. Soft, Nontender, Nondistended. MSK: no edema, cyanosis, or clubbing noted Skin: warm, dry Neuro: CN2-12 grossly intact. Strength 5/5 in upper and lower extremities. Reflexes symmetric and intact bilaterally.  Psych: Normal affect and thought content     Connie Kirk M. 10/31/16, MD 07/23/2020 3:52 PM

## 2020-07-23 NOTE — Assessment & Plan Note (Addendum)
Check lipids.  She will follow-up with cardiology soon.  May consider cardiac calcium scan.

## 2020-07-23 NOTE — Patient Instructions (Signed)
It was very nice to see you today!  We will check blood work today.  Please get your colonoscopy and Pap smear later this year.  I will see you back in year.  Come back to see me sooner if needed.  Take care, Dr Jerline Pain  PLEASE NOTE:  If you had any lab tests please let us know if you have not heard back within a few days. You may see your results on mychart before we have a chance to review them but we will give you a call once they are reviewed by [place]. If we ordered any referrals today, please let us know if you have not heard from their office within the next week.   Please try these tips to maintain a healthy lifestyle:   Eat at least 3 REAL meals and 1-2 snacks per day.  Aim for no more than 5 hours between eating.  If you eat breakfast, please do so within one hour of getting up.    Each meal should contain half fruits/vegetables, one quarter protein, and one quarter carbs (no bigger than a computer mouse)   Cut down on sweet beverages. This includes juice, soda, and sweet tea.     Drink at least 1 glass of water with each meal and aim for at least 8 glasses per day   Exercise at least 150 minutes every week.    Preventive Care 75-40 Years Old, Female Preventive care refers to lifestyle choices and visits with your health care provider that can promote health and wellness. This includes:  A yearly physical exam. This is also called an annual wellness visit.  Regular dental and eye exams.  Immunizations.  Screening for certain conditions.  Healthy lifestyle choices, such as: ? Eating a healthy diet. ? Getting regular exercise. ? Not using drugs or products that contain nicotine and tobacco. ? Limiting alcohol use. What can I expect for my preventive care visit? Physical exam Your health care provider will check your:  Height and weight. These may be used to calculate your BMI (body mass index). BMI is a measurement that tells if you are at a healthy  weight.  Heart rate and blood pressure.  Body temperature.  Skin for abnormal spots. Counseling Your health care provider may ask you questions about your:  Past medical problems.  Family's medical history.  Alcohol, tobacco, and drug use.  Emotional well-being.  Home life and relationship well-being.  Sexual activity.  Diet, exercise, and sleep habits.  Work and work Statistician.  Access to firearms.  Method of birth control.  Menstrual cycle.  Pregnancy history. What immunizations do I need? Vaccines are usually given at various ages, according to a schedule. Your health care provider will recommend vaccines for you based on your age, medical history, and lifestyle or other factors, such as travel or where you work.   What tests do I need? Blood tests  Lipid and cholesterol levels. These may be checked every 5 years, or more often if you are over 33 years old.  Hepatitis C test.  Hepatitis B test. Screening  Lung cancer screening. You may have this screening every year starting at age 32 if you have a 30-pack-year history of smoking and currently smoke or have quit within the past 15 years.  Colorectal cancer screening. ? All adults should have this screening starting at age 61 and continuing until age 22. ? Your health care provider may recommend screening at age 18 if you are at  increased risk. ? You will have tests every 1-10 years, depending on your results and the type of screening test.  Diabetes screening. ? This is done by checking your blood sugar (glucose) after you have not eaten for a while (fasting). ? You may have this done every 1-3 years.  Mammogram. ? This may be done every 1-2 years. ? Talk with your health care provider about when you should start having regular mammograms. This may depend on whether you have a family history of breast cancer.  BRCA-related cancer screening. This may be done if you have a family history of breast,  ovarian, tubal, or peritoneal cancers.  Pelvic exam and Pap test. ? This may be done every 3 years starting at age 73. ? Starting at age 23, this may be done every 5 years if you have a Pap test in combination with an HPV test. Other tests  STD (sexually transmitted disease) testing, if you are at risk.  Bone density scan. This is done to screen for osteoporosis. You may have this scan if you are at high risk for osteoporosis. Talk with your health care provider about your test results, treatment options, and if necessary, the need for more tests. Follow these instructions at home: Eating and drinking  Eat a diet that includes fresh fruits and vegetables, whole grains, lean protein, and low-fat dairy products.  Take vitamin and mineral supplements as recommended by your health care provider.  Do not drink alcohol if: ? Your health care provider tells you not to drink. ? You are pregnant, may be pregnant, or are planning to become pregnant.  If you drink alcohol: ? Limit how much you have to 0-1 drink a day. ? Be aware of how much alcohol is in your drink. In the [place] one drink equals one 12 oz bottle of beer (355 mL), one 5 oz glass of wine (148 mL), or one 1 oz glass of hard liquor (44 mL).   Lifestyle  Take daily care of your teeth and gums. Brush your teeth every morning and night with fluoride toothpaste. Floss one time each day.  Stay active. Exercise for at least 30 minutes 5 or more days each week.  Do not use any products that contain nicotine or tobacco, such as cigarettes, e-cigarettes, and chewing tobacco. If you need help quitting, ask your health care provider.  Do not use drugs.  If you are sexually active, practice safe sex. Use a condom or other form of protection to prevent STIs (sexually transmitted infections).  If you do not wish to become pregnant, use a form of birth control. If you plan to become pregnant, see your health care provider for a prepregnancy  visit.  If told by your health care provider, take low-dose aspirin daily starting at age 70.  Find healthy ways to cope with stress, such as: ? Meditation, yoga, or listening to music. ? Journaling. ? Talking to a trusted person. ? Spending time with friends and family. Safety  Always wear your seat belt while driving or riding in a vehicle.  Do not drive: ? If you have been drinking alcohol. Do not ride with someone who has been drinking. ? When you are tired or distracted. ? While texting.  Wear a helmet and other protective equipment during sports activities.  If you have firearms in your house, make sure you follow all gun safety procedures. What's next?  Visit your health care provider once a year for an annual wellness  visit.  Ask your health care provider how often you should have your eyes and teeth checked.  Stay up to date on all vaccines. This information is not intended to replace advice given to you by your health care provider. Make sure you discuss any questions you have with your health care provider. Document Revised: 11/28/2019 Document Reviewed: 11/04/2017 Elsevier Patient Education  2021 [employer].

## 2020-07-23 NOTE — Assessment & Plan Note (Signed)
Check vitamin D level 

## 2020-07-24 LAB — COMPREHENSIVE METABOLIC PANEL
ALT: 16 U/L (ref 0–35)
AST: 22 U/L (ref 0–37)
Albumin: 4.7 g/dL (ref 3.5–5.2)
Alkaline Phosphatase: 57 U/L (ref 39–117)
BUN: 13 mg/dL (ref 6–23)
CO2: 29 mEq/L (ref 19–32)
Calcium: 9.7 mg/dL (ref 8.4–10.5)
Chloride: 102 mEq/L (ref 96–112)
Creatinine, Ser: 0.79 mg/dL (ref 0.40–1.20)
GFR: 88.11 mL/min (ref >60.00)
Glucose, Bld: 88 mg/dL (ref 70–99)
Potassium: 3.8 mEq/L (ref 3.5–5.1)
Sodium: 141 mEq/L (ref 135–145)
Total Bilirubin: 0.4 mg/dL (ref 0.2–1.2)
Total Protein: 7.7 g/dL (ref 6.0–8.3)

## 2020-07-24 LAB — LIPID PANEL
Cholesterol: 215 mg/dL — ABNORMAL HIGH (ref 0–200)
HDL: 64.1 mg/dL (ref >39.00)
LDL Cholesterol: 121 mg/dL — ABNORMAL HIGH (ref 0–99)
NonHDL: 151.17
Total CHOL/HDL Ratio: 3
Triglycerides: 153 mg/dL — ABNORMAL HIGH (ref 0.0–149.0)
VLDL: 30.6 mg/dL (ref 0.0–40.0)

## 2020-07-24 LAB — CBC
HCT: 39 % (ref 36.0–46.0)
Hemoglobin: 13.4 g/dL (ref 12.0–15.0)
MCHC: 34.3 g/dL (ref 30.0–36.0)
MCV: 91.1 fl (ref 78.0–100.0)
Platelets: 211 10*3/uL (ref 150.0–400.0)
RBC: 4.28 Mil/uL (ref 3.87–5.11)
RDW: 14.2 % (ref 11.5–15.5)
WBC: 6.6 10*3/uL (ref 4.0–10.5)

## 2020-07-24 LAB — VITAMIN D 25 HYDROXY (VIT D DEFICIENCY, FRACTURES): VITD: 40.12 ng/mL (ref 30.00–100.00)

## 2020-07-24 LAB — TSH: TSH: 3.19 u[IU]/mL (ref 0.35–4.50)

## 2020-07-24 LAB — HEMOGLOBIN A1C: Hgb A1c MFr Bld: 5.8 % (ref 4.6–6.5)

## 2021-04-15 LAB — HM MAMMOGRAPHY

## 2021-04-28 ENCOUNTER — Encounter: Payer: Self-pay | Admitting: Family Medicine

## 2021-12-01 ENCOUNTER — Encounter: Payer: Self-pay | Admitting: *Deleted

## 2022-02-19 ENCOUNTER — Encounter: Payer: Self-pay | Admitting: *Deleted

## 2022-03-06 ENCOUNTER — Encounter: Payer: Self-pay | Admitting: Family Medicine

## 2022-03-06 DIAGNOSIS — Z1211 Encounter for screening for malignant neoplasm of colon: Secondary | ICD-10-CM

## 2022-03-12 ENCOUNTER — Encounter: Payer: Self-pay | Admitting: Internal Medicine

## 2022-03-26 ENCOUNTER — Ambulatory Visit (AMBULATORY_SURGERY_CENTER): Payer: No Typology Code available for payment source | Admitting: *Deleted

## 2022-03-26 VITALS — Ht 63.0 in | Wt 150.0 lb

## 2022-03-26 DIAGNOSIS — Z800 Family history of malignant neoplasm of digestive organs: Secondary | ICD-10-CM

## 2022-03-26 MED ORDER — NA SULFATE-K SULFATE-MG SULF 17.5-3.13-1.6 GM/177ML PO SOLN: 1.0000 | Freq: Once | ORAL | 0 refills | Status: AC

## 2022-03-26 NOTE — Progress Notes (Signed)
No egg or soy allergy known to patient  No issues known to pt with past sedation with any surgeries or procedures No issues head or neck No issues swallowing No FH of Malignant Hyperthermia Pt is not on diet pills Pt is not on  home 02  Pt is not on blood thinners  Pt denies issues with constipation  Pt is not on dialysis Pt denies any upcoming cardiac testing Pt encouraged to use to use Singlecare or Goodrx to reduce cost  Patient's chart reviewed by Osvaldo Angst CNRA prior to previsit and patient appropriate for the [place].  Previsit completed and red dot placed by patient's name on their procedure day (on provider's schedule).  . Visit by phone Instructions sent by mail with coupon and by my chart

## 2022-03-26 NOTE — Patient Instructions (Signed)
With coupon and

## 2022-04-17 ENCOUNTER — Encounter: Payer: PRIVATE HEALTH INSURANCE | Admitting: Family Medicine

## 2022-04-20 ENCOUNTER — Encounter: Payer: Self-pay | Admitting: Internal Medicine

## 2022-04-21 LAB — HM MAMMOGRAPHY: Negative: NEGATIVE

## 2022-04-24 ENCOUNTER — Encounter: Payer: Self-pay | Admitting: Family Medicine

## 2022-04-24 ENCOUNTER — Ambulatory Visit: Payer: No Typology Code available for payment source | Admitting: Family Medicine

## 2022-04-24 VITALS — BP 116/80 | HR 62 | Temp 98.0°F | Ht 63.0 in | Wt 158.2 lb

## 2022-04-24 DIAGNOSIS — R739 Hyperglycemia, unspecified: Secondary | ICD-10-CM | POA: Diagnosis not present

## 2022-04-24 DIAGNOSIS — R7989 Other specified abnormal findings of blood chemistry: Secondary | ICD-10-CM | POA: Diagnosis not present

## 2022-04-24 DIAGNOSIS — Z0001 Encounter for general adult medical examination with abnormal findings: Secondary | ICD-10-CM

## 2022-04-24 DIAGNOSIS — E785 Hyperlipidemia, unspecified: Secondary | ICD-10-CM

## 2022-04-24 LAB — COMPREHENSIVE METABOLIC PANEL
ALT: 30 U/L (ref 0–35)
AST: 25 U/L (ref 0–37)
Albumin: 4.8 g/dL (ref 3.5–5.2)
Alkaline Phosphatase: 99 U/L (ref 39–117)
BUN: 20 mg/dL (ref 6–23)
CO2: 27 mEq/L (ref 19–32)
Calcium: 9.8 mg/dL (ref 8.4–10.5)
Chloride: 104 mEq/L (ref 96–112)
Creatinine, Ser: 0.94 mg/dL (ref 0.40–1.20)
GFR: 70.65 mL/min (ref >60.00)
Glucose, Bld: 88 mg/dL (ref 70–99)
Potassium: 4.1 mEq/L (ref 3.5–5.1)
Sodium: 142 mEq/L (ref 135–145)
Total Bilirubin: 0.4 mg/dL (ref 0.2–1.2)
Total Protein: 8 g/dL (ref 6.0–8.3)

## 2022-04-24 LAB — VITAMIN D 25 HYDROXY (VIT D DEFICIENCY, FRACTURES): VITD: 41.66 ng/mL (ref 30.00–100.00)

## 2022-04-24 LAB — CBC
HCT: 42.3 % (ref 36.0–46.0)
Hemoglobin: 14.1 g/dL (ref 12.0–15.0)
MCHC: 33.4 g/dL (ref 30.0–36.0)
MCV: 91.1 fl (ref 78.0–100.0)
Platelets: 276 10*3/uL (ref 150.0–400.0)
RBC: 4.64 Mil/uL (ref 3.87–5.11)
RDW: 14.4 % (ref 11.5–15.5)
WBC: 6.9 10*3/uL (ref 4.0–10.5)

## 2022-04-24 LAB — LIPID PANEL
Cholesterol: 268 mg/dL — ABNORMAL HIGH (ref 0–200)
HDL: 59.7 mg/dL (ref >39.00)
LDL Cholesterol: 185 mg/dL — ABNORMAL HIGH (ref 0–99)
NonHDL: 208.16
Total CHOL/HDL Ratio: 4
Triglycerides: 114 mg/dL (ref 0.0–149.0)
VLDL: 22.8 mg/dL (ref 0.0–40.0)

## 2022-04-24 LAB — HEMOGLOBIN A1C: Hgb A1c MFr Bld: 6 % (ref 4.6–6.5)

## 2022-04-24 LAB — TSH: TSH: 2.53 u[IU]/mL (ref 0.35–5.50)

## 2022-04-24 MED ORDER — DEXAMETHASONE 4 MG PO TABS: 4.0000 mg | ORAL_TABLET | Freq: Three times a day (TID) | ORAL | 0 refills | Status: AC

## 2022-04-24 MED ORDER — ACETAZOLAMIDE 250 MG PO TABS: 250.0000 mg | ORAL_TABLET | Freq: Two times a day (BID) | ORAL | 0 refills | Status: DC

## 2022-04-24 NOTE — Patient Instructions (Signed)
It was very nice to see you today!  We will check blood work today.  I will send in medications for altitude sickness.  Please continue to work on diet and exercise.  I will see back in year for your next physical.  Come back sooner if needed.  Take care, Dr Jerline Pain  PLEASE NOTE:  If you had any lab tests, please let us know if you have not heard back within a few days. You may see your results on mychart before we have a chance to review them but we will give you a call once they are reviewed by [place].   If we ordered any referrals today, please let us know if you have not heard from their office within the next week.   If you had any urgent prescriptions sent in today, please check with the pharmacy within an hour of our visit to make sure the prescription was transmitted appropriately.   Please try these tips to maintain a healthy lifestyle:  Eat at least 3 REAL meals and 1-2 snacks per day.  Aim for no more than 5 hours between eating.  If you eat breakfast, please do so within one hour of getting up.   Each meal should contain half fruits/vegetables, one quarter protein, and one quarter carbs (no bigger than a computer mouse)  Cut down on sweet beverages. This includes juice, soda, and sweet tea.   Drink at least 1 glass of water with each meal and aim for at least 8 glasses per day  Exercise at least 150 minutes every week.    Preventive Care 54-41 Years Old, Female Preventive care refers to lifestyle choices and visits with your health care provider that can promote health and wellness. Preventive care visits are also called wellness exams. What can I expect for my preventive care visit? Counseling Your health care provider may ask you questions about your: Medical history, including: Past medical problems. Family medical history. Pregnancy history. Current health, including: Menstrual cycle. Method of birth control. Emotional well-being. Home life and relationship  well-being. Sexual activity and sexual health. Lifestyle, including: Alcohol, nicotine or tobacco, and drug use. Access to firearms. Diet, exercise, and sleep habits. Work and work Statistician. Sunscreen use. Safety issues such as seatbelt and bike helmet use. Physical exam Your health care provider will check your: Height and weight. These may be used to calculate your BMI (body mass index). BMI is a measurement that tells if you are at a healthy weight. Waist circumference. This measures the distance around your waistline. This measurement also tells if you are at a healthy weight and may help predict your risk of certain diseases, such as type 2 diabetes and high blood pressure. Heart rate and blood pressure. Body temperature. Skin for abnormal spots. What immunizations do I need?  Vaccines are usually given at various ages, according to a schedule. Your health care provider will recommend vaccines for you based on your age, medical history, and lifestyle or other factors, such as travel or where you work. What tests do I need? Screening Your health care provider may recommend screening tests for certain conditions. This may include: Lipid and cholesterol levels. Diabetes screening. This is done by checking your blood sugar (glucose) after you have not eaten for a while (fasting). Pelvic exam and Pap test. Hepatitis B test. Hepatitis C test. HIV (human immunodeficiency virus) test. STI (sexually transmitted infection) testing, if you are at risk. Lung cancer screening. Colorectal cancer screening. Mammogram. Talk with  your health care provider about when you should start having regular mammograms. This may depend on whether you have a family history of breast cancer. BRCA-related cancer screening. This may be done if you have a family history of breast, ovarian, tubal, or peritoneal cancers. Bone density scan. This is done to screen for osteoporosis. Talk with your health care  provider about your test results, treatment options, and if necessary, the need for more tests. Follow these instructions at home: Eating and drinking  Eat a diet that includes fresh fruits and vegetables, whole grains, lean protein, and low-fat dairy products. Take vitamin and mineral supplements as recommended by your health care provider. Do not drink alcohol if: Your health care provider tells you not to drink. You are pregnant, may be pregnant, or are planning to become pregnant. If you drink alcohol: Limit how much you have to 0-1 drink a day. Know how much alcohol is in your drink. In the [place] one drink equals one 12 oz bottle of beer (355 mL), one 5 oz glass of wine (148 mL), or one 1 oz glass of hard liquor (44 mL). Lifestyle Brush your teeth every morning and night with fluoride toothpaste. Floss one time each day. Exercise for at least 30 minutes 5 or more days each week. Do not use any products that contain nicotine or tobacco. These products include cigarettes, chewing tobacco, and vaping devices, such as e-cigarettes. If you need help quitting, ask your health care provider. Do not use drugs. If you are sexually active, practice safe sex. Use a condom or other form of protection to prevent STIs. If you do not wish to become pregnant, use a form of birth control. If you plan to become pregnant, see your health care provider for a prepregnancy visit. Take aspirin only as told by your health care provider. Make sure that you understand how much to take and what form to take. Work with your health care provider to find out whether it is safe and beneficial for you to take aspirin daily. Find healthy ways to manage stress, such as: Meditation, yoga, or listening to music. Journaling. Talking to a trusted person. Spending time with friends and family. Minimize exposure to UV radiation to reduce your risk of skin cancer. Safety Always wear your seat belt while driving or riding in  a vehicle. Do not drive: If you have been drinking alcohol. Do not ride with someone who has been drinking. When you are tired or distracted. While texting. If you have been using any mind-altering substances or drugs. Wear a helmet and other protective equipment during sports activities. If you have firearms in your house, make sure you follow all gun safety procedures. Seek help if you have been physically or sexually abused. What's next? Visit your health care provider once a year for an annual wellness visit. Ask your health care provider how often you should have your eyes and teeth checked. Stay up to date on all vaccines. This information is not intended to replace advice given to you by your health care provider. Make sure you discuss any questions you have with your health care provider. Document Revised: 08/21/2020 Document Reviewed: 08/21/2020 Elsevier Patient Education  [place].

## 2022-04-24 NOTE — Assessment & Plan Note (Signed)
Check A1c.  Discussed lifestyle modifications.  If A1c is elevated would consider starting GLP-1 agonist such as Mounjaro.

## 2022-04-24 NOTE — Assessment & Plan Note (Signed)
Check vitamin D. 

## 2022-04-24 NOTE — Assessment & Plan Note (Signed)
Check lipids. Discussed lifestyle modifications.  

## 2022-04-24 NOTE — Progress Notes (Signed)
Chief Complaint:  Connie Kirk is a 52 y.o. female who presents today for her annual comprehensive physical exam.    Assessment/Plan:  New/Acute Problems: Encounter for Travel Medicine Will be going to [place] soon and has had issues with altitude sickness in the past.  Will send in prophylactic acetazolamide.  Will also send in pocket prescription for dexamethasone.  She has done well with both of these in the past.  Chronic Problems Addressed Today: Low vitamin D level Check vitamin D.   Hyperglycemia Check A1c.  Discussed lifestyle modifications.  If A1c is elevated would consider starting GLP-1 agonist such as Mounjaro.  Dyslipidemia Check lipids.  Discussed lifestyle modifications.  Preventative Healthcare: Check labs.  Getting colonoscopy soon.  Will follow-up with OB/GYN for Pap.  Discussed shingles vaccine.  Patient Counseling(The following topics were reviewed and/or handout was given):  -Nutrition: Stressed importance of moderation in sodium/caffeine intake, saturated fat and cholesterol, caloric balance, sufficient intake of fresh fruits, vegetables, and fiber.  -Stressed the importance of regular exercise.   -Substance Abuse: Discussed cessation/primary prevention of tobacco, alcohol, or other drug use; driving or other dangerous activities under the influence; availability of treatment for abuse.   -Injury prevention: Discussed safety belts, safety helmets, smoke detector, smoking near bedding or upholstery.   -Sexuality: Discussed sexually transmitted diseases, partner selection, use of condoms, avoidance of unintended pregnancy and contraceptive alternatives.   -Dental health: Discussed importance of regular tooth brushing, flossing, and dental visits.  -Health maintenance and immunizations reviewed. Please refer to Health maintenance section.  Return to care in 1 year for next preventative visit.     Subjective:  HPI:  She has no acute complaints  today.   Going on ski trip to [place] in a few weeks.  She has had issues with altitude sickness in the past.  She would like to start acetazolamide to prevent this.  She would also like to have a partial prescription for dexamethasone to use in case she does acquire altitude sickness.  She has been on both these in the past and has done well.  Lifestyle Diet: Overall balanced. Wants to cut down on carbs.  Exercise: None specific. Wants to get back into a routine.      04/24/2022    9:03 AM  Depression screen PHQ 2/9  Decreased Interest 0  Down, Depressed, Hopeless 0  PHQ - 2 Score 0    Health Maintenance Due  Topic Date Due   COVID-19 Vaccine (1) Never done   COLONOSCOPY (Pts 45-35yr Insurance coverage will need to be confirmed)  08/31/2016   PAP SMEAR-Modifier  02/16/2021     ROS: Per HPI, otherwise a complete review of systems was negative.   PMH:  The following were reviewed and entered/updated in epic: Past Medical History:  Diagnosis Date   Abnormal EKG    Heart murmur    Irregular heartbeat    Mitral regurgitation 08/18/2010   Patient Active Problem List   Diagnosis Date Noted   Hyperglycemia 04/25/2018   Dyslipidemia 04/25/2018   Low vitamin D level 04/22/2018   SVT (supraventricular tachycardia) 10/31/2016   Abnormal EKG 08/18/2010   Past Surgical History:  Procedure Laterality Date   CESAREAN SECTION     sept 2009    Family History  Problem Relation Age of Onset   Alcohol abuse Mother    Arthritis Mother    Colon polyps Father    Abnormal EKG Father    Arthritis Father  Hyperlipidemia Father    Colon cancer Father    Prostate cancer Father    Heart disease Maternal Grandmother    Diabetes Maternal Grandmother    Breast cancer Paternal Grandmother        breast    Heart disease Paternal Grandfather    Sudden death Paternal Grandfather 71   Diabetes Other        family hx of   Hypertension Other        family hx of   Coronary artery  disease Other        family hx of   Esophageal cancer Neg Hx    Rectal cancer Neg Hx    Stomach cancer Neg Hx     Medications- reviewed and updated Current Outpatient Medications  Medication Sig Dispense Refill   acetaZOLAMIDE (DIAMOX) 250 MG tablet Take 1 tablet (250 mg total) by mouth 2 (two) times daily for 10 days. 20 tablet 0   calcium carbonate (OS-CAL) 600 MG TABS tablet Take by mouth.     Cholecalciferol (VITAMIN D-3) 25 MCG (1000 UT) CAPS Take by mouth.     dexamethasone (DECADRON) 4 MG tablet Take 1 tablet (4 mg total) by mouth 3 (three) times daily for 7 days. 21 tablet 0   levonorgestrel (MIRENA) 20 MCG/24HR IUD by Intrauterine route.     No current facility-administered medications for this visit.    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  Tobacco Use   Smoking status: Never   Smokeless tobacco: Never  Vaping Use   Vaping Use: Never used  Substance and Sexual Activity   Alcohol use: Yes    Comment: occ   Drug use: No   Sexual activity: Not on file  Other Topics Concern   Not on file  Social History Narrative   Not on file   Social Determinants of Health   Financial Resource Strain: Not on file  Food Insecurity: Not on file  Transportation Needs: Not on file  Physical Activity: Not on file  Stress: Not on file  Social Connections: Not on file        Objective:  Physical Exam: BP 116/80   Pulse 62   Temp 98 F (36.7 C) (Temporal)   Ht 5' 3"$  (1.6 m)   Wt 158 lb 3.2 oz (71.8 kg)   SpO2 99%   BMI 28.02 kg/m   Body mass index is 28.02 kg/m. Wt Readings from Last 3 Encounters:  04/24/22 158 lb 3.2 oz (71.8 kg)  03/26/22 150 lb (68 kg)  07/23/20 131 lb 12.8 oz (59.8 kg)   Gen: NAD,  resting comfortably HEENT: TMs normal bilaterally. OP clear. No thyromegaly noted.  CV: RRR with no murmurs appreciated Pulm: NWOB, CTAB with no crackles, wheezes, or rhonchi GI: Normal bowel sounds present. Soft, Nontender, Nondistended. MSK: no edema, cyanosis, or clubbing noted Skin: warm, dry Neuro: CN2-12 grossly intact. Strength 5/5 in upper and lower extremities. Reflexes symmetric and intact bilaterally.  Psych: Normal affect and thought content     Lyall Faciane M. Jerline Pain, MD 04/24/2022 9:34 AM

## 2022-04-27 ENCOUNTER — Encounter: Payer: Self-pay | Admitting: Family Medicine

## 2022-04-27 NOTE — Progress Notes (Signed)
Please inform patient of the following:  Her cholesterol is up quite a bit since last time.  She is not at the point where we would have to start a statin however she should really focus on diet and exercise and we can recheck this in a year.  Her A1c is also in the borderline range but stable compared to previous values.  We can also recheck this in year.  The rest of her labs are all normal.

## 2022-04-27 NOTE — Telephone Encounter (Signed)
We can try starting ozempic but I do not think her insurance will pay for this as she is not "diabetic."   For her cholesterol, another option is to get a cardiac CT scan to look for any signs of calcifications in her coronary arteries.  If this is all clear then we can hold off on statin for a few years however if she does have any signs of calcifications may be beneficial to start a statin early.  Algis Greenhouse. Jerline Pain, MD 04/27/2022 3:22 PM

## 2022-04-28 NOTE — Telephone Encounter (Signed)
See note

## 2022-04-28 NOTE — Telephone Encounter (Signed)
Please see her other mychart message.  Algis Greenhouse. Jerline Pain, MD 04/28/2022 3:20 PM

## 2022-05-01 ENCOUNTER — Encounter: Payer: Self-pay | Admitting: Internal Medicine

## 2022-05-01 ENCOUNTER — Other Ambulatory Visit: Payer: Self-pay | Admitting: *Deleted

## 2022-05-01 ENCOUNTER — Ambulatory Visit: Payer: No Typology Code available for payment source | Admitting: Internal Medicine

## 2022-05-01 VITALS — BP 134/86 | HR 59 | Temp 95.5°F | Resp 17 | Ht 63.0 in | Wt 150.0 lb

## 2022-05-01 DIAGNOSIS — Z1211 Encounter for screening for malignant neoplasm of colon: Secondary | ICD-10-CM | POA: Diagnosis present

## 2022-05-01 DIAGNOSIS — Z800 Family history of malignant neoplasm of digestive organs: Secondary | ICD-10-CM

## 2022-05-01 MED ORDER — SODIUM CHLORIDE 0.9 % IV SOLN: 500.0000 mL | Freq: Once | INTRAVENOUS | Status: DC

## 2022-05-01 MED ORDER — OZEMPIC (0.25 OR 0.5 MG/DOSE) 2 MG/3ML ~~LOC~~ SOPN: 0.2500 mg | PEN_INJECTOR | SUBCUTANEOUS | 1 refills | Status: DC

## 2022-05-01 NOTE — Op Note (Signed)
[facility] Patient Name: Connie Kirk Procedure Date: 05/01/2022 2:16 PM MRN: [id number] Endoscopist: Jerene Bears , MD, [id number] Age: 52 Referring MD:  Date of Birth: 03/25/1970 Gender: Female Account #: [id number] Procedure:                Colonoscopy Indications:              Screening in patient at increased risk: Family                            history of 1st-degree relative with colorectal                            cancer, Last colonoscopy: June 2013 Medicines:                Monitored Anesthesia Care Procedure:                Pre-Anesthesia Assessment:                           - Prior to the procedure, a History and Physical                            was performed, and patient medications and                            allergies were reviewed. The patient's tolerance of                            previous anesthesia was also reviewed. The risks                            and benefits of the procedure and the sedation                            options and risks were discussed with the patient.                            All questions were answered, and informed consent                            was obtained. Prior Anticoagulants: The patient has                            taken no anticoagulant or antiplatelet agents. ASA                            Grade Assessment: II - A patient with mild systemic                            disease. After reviewing the risks and benefits,                            the patient was deemed in satisfactory condition to  undergo the procedure.                           After obtaining informed consent, the colonoscope                            was passed under direct vision. Throughout the                            procedure, the patient's blood pressure, pulse, and                            oxygen saturations were monitored continuously. The                            PCF-HQ190L Colonoscope  [id number] was introduced                            through the anus and advanced to the cecum,                            identified by appendiceal orifice and ileocecal                            valve. The colonoscopy was performed without                            difficulty. The patient tolerated the procedure                            well. The quality of the bowel preparation was                            excellent. The ileocecal valve, appendiceal                            orifice, and rectum were photographed. Scope In: 2:34:45 PM Scope Out: 2:50:09 PM Scope Withdrawal Time: 0 hours 9 minutes 22 seconds  Total Procedure Duration: 0 hours 15 minutes 24 seconds  Findings:                 The digital rectal exam was normal.                           The colon (entire examined portion) appeared normal.                           Internal hemorrhoids were found during                            retroflexion. The hemorrhoids were small. Complications:            No immediate complications. Estimated Blood Loss:     Estimated blood loss: none. Impression:               - The entire examined colon is normal.                           -  Small internal hemorrhoids.                           - No specimens collected. Recommendation:           - Patient has a contact number available for                            emergencies. The signs and symptoms of potential                            delayed complications were discussed with the                            patient. Return to normal activities tomorrow.                            Written discharge instructions were provided to the                            patient.                           - Resume previous diet.                           - Continue present medications.                           - Repeat colonoscopy in 5 years for screening                            purposes. Jerene Bears, MD 05/01/2022 2:52:21 PM This report has  been signed electronically.

## 2022-05-01 NOTE — Progress Notes (Signed)
Sedate, gd SR, tolerated procedure well, VSS, report to RN 

## 2022-05-01 NOTE — Patient Instructions (Signed)
Thank you for letting us take care of your healthcare needs today. Please see handouts given to you on Hemorrhoids.    YOU HAD AN ENDOSCOPIC PROCEDURE TODAY AT [facility]:   Refer to the procedure report that was given to you for any specific questions about what was found during the examination.  If the procedure report does not answer your questions, please call your gastroenterologist to clarify.  If you requested that your care partner not be given the details of your procedure findings, then the procedure report has been included in a sealed envelope for you to review at your convenience later.  YOU SHOULD EXPECT: Some feelings of bloating in the abdomen. Passage of more gas than usual.  Walking can help get rid of the air that was put into your GI tract during the procedure and reduce the bloating. If you had a lower endoscopy (such as a colonoscopy or flexible sigmoidoscopy) you may notice spotting of blood in your stool or on the toilet paper. If you underwent a bowel prep for your procedure, you may not have a normal bowel movement for a few days.  Please Note:  You might notice some irritation and congestion in your nose or some drainage.  This is from the oxygen used during your procedure.  There is no need for concern and it should clear up in a day or so.  SYMPTOMS TO REPORT IMMEDIATELY:  Following lower endoscopy (colonoscopy or flexible sigmoidoscopy):  Excessive amounts of blood in the stool  Significant tenderness or worsening of abdominal pains  Swelling of the abdomen that is new, acute  Fever of 100F or higher   For urgent or emergent issues, a gastroenterologist can be reached at any hour by calling [phone]. Do not use MyChart messaging for urgent concerns.    DIET:  We do recommend a small meal at first, but then you may proceed to your regular diet.  Drink plenty of fluids but you should avoid alcoholic beverages for 24 hours.  ACTIVITY:  You  should plan to take it easy for the rest of today and you should NOT DRIVE or use heavy machinery until tomorrow (because of the sedation medicines used during the test).    FOLLOW UP: Our staff will call the number listed on your records the next business day following your procedure.  We will call around 7:15- 8:00 am to check on you and address any questions or concerns that you may have regarding the information given to you following your procedure. If we do not reach you, we will leave a message.     If any biopsies were taken you will be contacted by phone or by letter within the next 1-3 weeks.  Please call us at [phone] if you have not heard about the biopsies in 3 weeks.    SIGNATURES/CONFIDENTIALITY: You and/or your care partner have signed paperwork which will be entered into your electronic medical record.  These signatures attest to the fact that that the information above on your After Visit Summary has been reviewed and is understood.  Full responsibility of the confidentiality of this discharge information lies with you and/or your care-partner.

## 2022-05-01 NOTE — Progress Notes (Signed)
Pt's states no medical or surgical changes since previsit or office visit. 

## 2022-05-01 NOTE — Telephone Encounter (Signed)
Ok to send in ozempic 0.'25mg'$  weekly x 4 weeks then increase to 0.'5mg'$  weekly.  Algis Greenhouse. Jerline Pain, MD 05/01/2022 12:51 PM

## 2022-05-01 NOTE — Progress Notes (Signed)
GASTROENTEROLOGY PROCEDURE H&P NOTE   Primary Care Physician: Vivi Barrack, MD    Reason for Procedure:  Screening for family history of colon cancer  Plan:    Colonoscopy  Patient is appropriate for endoscopic procedure(s) in the ambulatory ([facility]) setting.  The nature of the procedure, as well as the risks, benefits, and alternatives were carefully and thoroughly reviewed with the patient. Ample time for discussion and questions allowed. The patient understood, was satisfied, and agreed to proceed.     HPI: Connie Kirk is a 52 y.o. female who presents for colonoscopy.  Medical history as below.  Tolerated the prep.  No recent chest pain or shortness of breath.  No abdominal pain today.  Past Medical History:  Diagnosis Date   Abnormal EKG    Heart murmur    Irregular heartbeat    Mitral regurgitation 08/18/2010    Past Surgical History:  Procedure Laterality Date   CESAREAN SECTION     sept 2009    Prior to Admission medications   Medication Sig Start Date End Date Taking? Authorizing Provider  calcium carbonate (OS-CAL) 600 MG TABS tablet Take by mouth.   Yes [provider]  Cholecalciferol (VITAMIN D-3) 25 MCG (1000 UT) CAPS Take by mouth.   Yes [provider]  acetaZOLAMIDE (DIAMOX) 250 MG tablet Take 1 tablet (250 mg total) by mouth 2 (two) times daily for 10 days. 04/24/22 05/04/22  Vivi Barrack, MD  dexamethasone (DECADRON) 4 MG tablet Take 1 tablet (4 mg total) by mouth 3 (three) times daily for 7 days. 04/24/22 05/01/22  Vivi Barrack, MD  levonorgestrel [facility]) 20 MCG/24HR IUD by Intrauterine route.    [provider]    Current Outpatient Medications  Medication Sig Dispense Refill   calcium carbonate (OS-CAL) 600 MG TABS tablet Take by mouth.     Cholecalciferol (VITAMIN D-3) 25 MCG (1000 UT) CAPS Take by mouth.     acetaZOLAMIDE (DIAMOX) 250 MG tablet Take 1 tablet (250 mg total) by mouth 2 (two) times daily for 10  days. 20 tablet 0   dexamethasone (DECADRON) 4 MG tablet Take 1 tablet (4 mg total) by mouth 3 (three) times daily for 7 days. 21 tablet 0   levonorgestrel (MIRENA) 20 MCG/24HR IUD by Intrauterine route.     Current Facility-Administered Medications  Medication Dose Route Frequency Provider Last Rate Last Admin   0.9 %  sodium chloride infusion  500 mL Intravenous Once Tamasha Laplante, Lajuan Lines, MD        Allergies as of 05/01/2022   (No Known Allergies)    Family History  Problem Relation Age of Onset   Alcohol abuse Mother    Arthritis Mother    Colon polyps Father    Abnormal EKG Father    Arthritis Father    Hyperlipidemia Father    Colon cancer Father    Prostate cancer Father    Heart disease Maternal Grandmother    Diabetes Maternal Grandmother    Breast cancer Paternal Grandmother        breast    Heart disease Paternal Grandfather    Sudden death Paternal Grandfather 97   Diabetes Other        family hx of   Hypertension Other        family hx of   Coronary artery disease Other        family hx of   Esophageal cancer Neg Hx    Rectal cancer Neg  Hx    Stomach cancer Neg Hx     Social History   Socioeconomic History   Marital status: Married    Spouse name: Not on file   Number of children: 3   Years of education: Not on file   Highest education level: Not on file  Occupational History   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  Tobacco Use   Smoking status: Never   Smokeless tobacco: Never  Vaping Use   Vaping Use: Never used  Substance and Sexual Activity   Alcohol use: Yes    Comment: occ   Drug use: No   Sexual activity: Not on file  Other Topics Concern   Not on file  Social History Narrative   Not on file   Social Determinants of Health   Financial Resource Strain: Not on file  Food Insecurity: Not on file  Transportation Needs: Not on file  Physical Activity: Not on file  Stress: Not on file  Social Connections: Not on file  Intimate Partner Violence:  Not on file    Physical Exam: Vital signs in last 24 hours: '@BP'$  122/86   Pulse (!) 56   Temp (!) 95.5 F (35.3 C)   Ht '5\' 3"'$  (1.6 m)   Wt 150 lb (68 kg)   SpO2 98%   BMI 26.57 kg/m  GEN: NAD EYE: Sclerae anicteric ENT: MMM CV: Non-tachycardic Pulm: CTA b/l GI: Soft, NT/ND NEURO:  Alert & Oriented x 3   Zenovia Jarred, MD [facility]  05/01/2022 2:26 PM

## 2022-05-04 ENCOUNTER — Telehealth: Payer: Self-pay

## 2022-05-04 NOTE — Telephone Encounter (Signed)
Attempted f/u call. No answer, left VM. 

## 2022-12-04 ENCOUNTER — Encounter: Payer: Self-pay | Admitting: Family Medicine

## 2022-12-07 ENCOUNTER — Other Ambulatory Visit: Payer: Self-pay | Admitting: *Deleted

## 2022-12-07 DIAGNOSIS — E785 Hyperlipidemia, unspecified: Secondary | ICD-10-CM

## 2022-12-07 DIAGNOSIS — R9431 Abnormal electrocardiogram [ECG] [EKG]: Secondary | ICD-10-CM

## 2022-12-07 NOTE — Telephone Encounter (Signed)
Ok to place referral.  Connie Kirk. Jimmey Ralph, MD 12/07/2022 2:51 PM

## 2023-02-11 NOTE — Progress Notes (Signed)
Cardiology Office Note   Date:  02/12/2023   ID:  Connie Kirk, DOB 1970-04-16, MRN [id number]  PCP:  Ardith Dark, MD  Cardiologist:   Rollene Rotunda, MD Referring:  Ardith Dark, MD  Chief Complaint  Patient presents with   Abnormal ECG      History of Present Illness: Connie Kirk is a 52 y.o. female who presents for who presents for evaluation of an abnormal EKG. I have seen her couple of times over the years for this. She's had a normal echo with normal bubble study in the past. She has a structurally normal  heart except for some mild mitral regurgitation. In 2012 she had a negative POET (Plain Old Exercise Treadmill).   I saw her last in 2018 and she had some tachypalpitations.  She a negative POET (Plain Old Exercise Treadmill) at that time.  MRI demonstrated a small area of mid myocardial gad uptake in the septum and anterior wall.    She presents for follow-up.  She has had no new palpitations which she was having previously.  She has not been exercising routinely.  She has gained some weight.  The patient denies any new symptoms such as chest discomfort, neck or arm discomfort. There has been no new shortness of breath, PND or orthopnea. There have been no reported palpitations, presyncope or syncope.      Past Medical History:  Diagnosis Date   Abnormal EKG    Heart murmur    Irregular heartbeat    Mitral regurgitation 08/18/2010    Past Surgical History:  Procedure Laterality Date   CESAREAN SECTION     sept 2009     Current Outpatient Medications  Medication Sig Dispense Refill   calcium carbonate (OS-CAL) 600 MG TABS tablet Take by mouth.     Cholecalciferol (VITAMIN D-3) 25 MCG (1000 UT) CAPS Take by mouth.     levonorgestrel (MIRENA) 20 MCG/24HR IUD by Intrauterine route.     rosuvastatin (CRESTOR) 20 MG tablet Take 1 tablet (20 mg total) by mouth daily. 90 tablet 3   acetaZOLAMIDE (DIAMOX) 250 MG tablet Take 1 tablet (250 mg total) by  mouth 2 (two) times daily for 10 days. 20 tablet 0   Semaglutide,0.25 or 0.5MG /DOS, (OZEMPIC, 0.25 OR 0.5 MG/DOSE,) 2 MG/3ML SOPN Inject 0.25 mg into the skin once a week. For 4 weeks then increased 0.5 mg weekly 3 mL 1   No current facility-administered medications for this visit.    Allergies:   Patient has no known allergies.    Social History:  The patient  reports that she has never smoked. She has never used smokeless tobacco. She reports current alcohol use. She reports that she does not use drugs.   Family History:  The patient's family history includes Abnormal EKG in her father; Alcohol abuse in her mother; Arthritis in her father and mother; Breast cancer in her paternal grandmother; Colon cancer in her father; Colon polyps in her father; Coronary artery disease in an other family member; Diabetes in her maternal grandmother and another family member; Heart disease in her maternal grandmother and paternal grandfather; Hyperlipidemia in her father; Hypertension in an other family member; Prostate cancer in her father; Sudden death (age of onset: 78) in her paternal grandfather.    ROS:  Please see the history of present illness.   Otherwise, review of systems are positive for none.   All other systems are reviewed and negative.  PHYSICAL EXAM: VS:  BP 122/78 (BP Location: Left Arm, Patient Position: Sitting, Cuff Size: Normal)   Pulse 71   Ht 5\' 3"  (1.6 m)   Wt 163 lb (73.9 kg)   SpO2 99%   BMI 28.87 kg/m  , BMI Body mass index is 28.87 kg/m. GENERAL:  Well appearing HEENT:  Pupils equal round and reactive, fundi not visualized, oral mucosa unremarkable NECK:  No jugular venous distention, waveform within normal limits, carotid upstroke brisk and symmetric, no bruits, no thyromegaly LYMPHATICS:  No cervical, inguinal adenopathy LUNGS:  Clear to auscultation bilaterally BACK:  No CVA tenderness CHEST:  Unremarkable HEART:  PMI not displaced or sustained,S1 and S2 within  normal limits, no S3, no S4, no clicks, no rubs, no murmurs ABD:  Flat, positive bowel sounds normal in frequency in pitch, no bruits, no rebound, no guarding, no midline pulsatile mass, no hepatomegaly, no splenomegaly EXT:  2 plus pulses throughout, no edema, no cyanosis no clubbing SKIN:  No rashes no nodules NEURO:  Cranial nerves II through XII grossly intact, motor grossly intact throughout [facility]:  Cognitively intact, oriented to person place and time    EKG:  EKG Interpretation Date/Time:  Friday February 12 2023 13:11:18 EST Ventricular Rate:  71 PR Interval:  170 QRS Duration:  142 QT Interval:  410 QTC Calculation: 445 R Axis:   9  Text Interpretation: Normal sinus rhythm Right bundle branch block Left ventricular hypertrophy with repolarization abnormality ( R in aVL ) No change since previous. Confirmed by Rollene Rotunda ([id number]) on 02/12/2023 1:46:38 PM     Recent Labs: 04/24/2022: ALT 30; BUN 20; Creatinine, Ser 0.94; Hemoglobin 14.1; Platelets 276.0; Potassium 4.1; Sodium 142; TSH 2.53    Lipid Panel    Component Value Date/Time   CHOL 268 (H) 04/24/2022 1002   TRIG 114.0 04/24/2022 1002   HDL 59.70 04/24/2022 1002   CHOLHDL 4 04/24/2022 1002   VLDL 22.8 04/24/2022 1002   LDLCALC 185 (H) 04/24/2022 1002      Wt Readings from Last 3 Encounters:  02/12/23 163 lb (73.9 kg)  05/01/22 150 lb (68 kg)  04/24/22 158 lb 3.2 oz (71.8 kg)      Other studies Reviewed: Additional studies/ records that were reviewed today include: Labs. Review of the above records demonstrates:  Please see elsewhere in the note.     ASSESSMENT AND PLAN:  ABNORMAL EKG: She has had some mild gadolinium uptake previously in her septum on previous MRI.  She has abnormal EKG and a grandfather with a history of sudden cardiac death.  She needs a repeat cardiac MRI as it has been 6 years.  I have a low threshold for genetic testing if it comes back abnormal.  DYSLIPIDEMIA: LDL was 185  with an HDL of 59.  We talked about a Mediterranean plant-based diet.  I would suggest Crestor 20 mg daily.   Current medicines are reviewed at length with the patient today.  The patient does not have concerns regarding medicines.  The following changes have been made:  no change  Labs/ tests ordered today include:   Orders Placed This Encounter  Procedures   MR CARDIAC MORPHOLOGY W WO CONTRAST   Lipid panel   Hepatic function panel   EKG 12-Lead     Disposition:   FU with with me in 2 years or sooner if needed.     Signed, Rollene Rotunda, MD  02/12/2023 2:03 PM    [facility]

## 2023-02-12 ENCOUNTER — Encounter: Payer: Self-pay | Admitting: Cardiology

## 2023-02-12 ENCOUNTER — Ambulatory Visit: Payer: No Typology Code available for payment source | Attending: Cardiology | Admitting: Cardiology

## 2023-02-12 VITALS — BP 122/78 | HR 71 | Ht 63.0 in | Wt 163.0 lb

## 2023-02-12 DIAGNOSIS — I471 Supraventricular tachycardia, unspecified: Secondary | ICD-10-CM | POA: Diagnosis not present

## 2023-02-12 DIAGNOSIS — R9431 Abnormal electrocardiogram [ECG] [EKG]: Secondary | ICD-10-CM

## 2023-02-12 DIAGNOSIS — E785 Hyperlipidemia, unspecified: Secondary | ICD-10-CM

## 2023-02-12 MED ORDER — ROSUVASTATIN CALCIUM 20 MG PO TABS: 20.0000 mg | ORAL_TABLET | Freq: Every day | ORAL | 3 refills | Status: AC

## 2023-02-12 NOTE — Patient Instructions (Signed)
Medication Instructions:  Start Crestor 20 mg daily.New script sent. *If you need a refill on your cardiac medications before your next appointment, please call your pharmacy*   Lab Work: Fasting Lipid and Liver profile in 3 months.  If you have labs (blood work) drawn today and your tests are completely normal, you will receive your results only by: MyChart Message (if you have MyChart) OR A paper copy in the mail If you have any lab test that is abnormal or we need to change your treatment, we will call you to review the results.   Testing/Procedures: Your physician has requested that you have a cardiac MRI. Cardiac MRI uses a computer to create images of your heart as its beating, producing both still and moving pictures of your heart and major blood vessels. For further information please visit [email or web address]. Please follow the instruction sheet given to you today for more information.    Follow-Up: At [facility], you and your health needs are our priority.  As part of our continuing mission to provide you with exceptional heart care, we have created designated Provider Care Teams.  These Care Teams include your primary Cardiologist (physician) and Advanced Practice Providers (APPs -  Physician Assistants and Nurse Practitioners) who all work together to provide you with the care you need, when you need it.  We recommend signing up for the patient portal called "MyChart".  Sign up information is provided on this After Visit Summary.  MyChart is used to connect with patients for Virtual Visits (Telemedicine).  Patients are able to view lab/test results, encounter notes, upcoming appointments, etc.  Non-urgent messages can be sent to your provider as well.   To learn more about what you can do with MyChart, go to [email or web address].    Your next appointment:   2 year(s)  Provider:   Rollene Rotunda, MD

## 2023-02-18 ENCOUNTER — Encounter (HOSPITAL_COMMUNITY): Payer: Self-pay

## 2023-02-22 ENCOUNTER — Ambulatory Visit (HOSPITAL_COMMUNITY): Payer: No Typology Code available for payment source

## 2023-02-25 ENCOUNTER — Telehealth (HOSPITAL_COMMUNITY): Payer: Self-pay | Admitting: *Deleted

## 2023-02-25 NOTE — Telephone Encounter (Signed)
Reaching out to patient to offer assistance regarding upcoming cardiac imaging study; pt verbalizes understanding of appt date/time, parking situation and where to check in, and verified current allergies; name and call back number provided for further questions should they arise  Larey Brick RN Navigator Cardiac Imaging [facility] [phone] office [phone] cell  Patient denies metal or claustrophobia but reports she is a difficult IV.

## 2023-02-26 ENCOUNTER — Other Ambulatory Visit: Payer: Self-pay | Admitting: Cardiology

## 2023-02-26 ENCOUNTER — Ambulatory Visit (HOSPITAL_COMMUNITY)
Admission: RE | Admit: 2023-02-26 | Discharge: 2023-02-26 | Disposition: A | Discharge status: Home / Self Care | Payer: No Typology Code available for payment source | Source: Ambulatory Visit | Attending: Cardiology | Admitting: Cardiology

## 2023-02-26 DIAGNOSIS — I471 Supraventricular tachycardia, unspecified: Secondary | ICD-10-CM

## 2023-02-26 DIAGNOSIS — R9431 Abnormal electrocardiogram [ECG] [EKG]: Secondary | ICD-10-CM

## 2023-02-26 MED ORDER — GADOBUTROL 1 MMOL/ML IV SOLN
8.0000 mL | Freq: Once | INTRAVENOUS | Status: AC | PRN
  Administered 2023-02-26: 8 mL via INTRAVENOUS

## 2023-04-27 LAB — HM MAMMOGRAPHY

## 2023-04-30 ENCOUNTER — Encounter: Payer: Self-pay | Admitting: Family Medicine

## 2023-05-25 ENCOUNTER — Ambulatory Visit: Admitting: Family Medicine

## 2023-05-25 ENCOUNTER — Encounter: Payer: Self-pay | Admitting: Family Medicine

## 2023-05-25 VITALS — BP 138/87 | HR 63 | Temp 97.3°F | Ht 63.0 in | Wt 158.0 lb

## 2023-05-25 DIAGNOSIS — R059 Cough, unspecified: Secondary | ICD-10-CM | POA: Diagnosis not present

## 2023-05-25 DIAGNOSIS — E785 Hyperlipidemia, unspecified: Secondary | ICD-10-CM

## 2023-05-25 DIAGNOSIS — R739 Hyperglycemia, unspecified: Secondary | ICD-10-CM

## 2023-05-25 DIAGNOSIS — S2231XA Fracture of one rib, right side, initial encounter for closed fracture: Secondary | ICD-10-CM | POA: Diagnosis not present

## 2023-05-25 MED ORDER — HYDROCODONE BIT-HOMATROP MBR 5-1.5 MG/5ML PO SOLN
5.0000 mL | Freq: Three times a day (TID) | ORAL | 0 refills | Status: AC | PRN
Start: 2023-05-25 — End: ?

## 2023-05-25 MED ORDER — BENZONATATE 200 MG PO CAPS: 200.0000 mg | ORAL_CAPSULE | Freq: Two times a day (BID) | ORAL | 0 refills | Status: AC | PRN

## 2023-05-25 MED ORDER — AZELASTINE HCL 0.1 % NA SOLN: 2.0000 | Freq: Two times a day (BID) | NASAL | 12 refills | Status: AC

## 2023-05-25 NOTE — Patient Instructions (Signed)
 It was very nice to see you today!  I will send in a prescription for cough syrup.  Will also send in Tessalon.  Please finish a course of doxycycline.  I think your cough is probably due to a secondary infection from your flu infection a few weeks ago.  This should continue to improve.  We will order a bone density scan today.  Please let me know if your cough is not improving in the next 1 to 2 weeks and we can check a CT scan at that time.  Return for Annual Physical.   Take care, Dr Jimmey Ralph  PLEASE NOTE:  If you had any lab tests, please let us know if you have not heard back within a few days. You may see your results on mychart before we have a chance to review them but we will give you a call once they are reviewed by [place].   If we ordered any referrals today, please let us know if you have not heard from their office within the next week.   If you had any urgent prescriptions sent in today, please check with the pharmacy within an hour of our visit to make sure the prescription was transmitted appropriately.   Please try these tips to maintain a healthy lifestyle:  Eat at least 3 REAL meals and 1-2 snacks per day.  Aim for no more than 5 hours between eating.  If you eat breakfast, please do so within one hour of getting up.   Each meal should contain half fruits/vegetables, one quarter protein, and one quarter carbs (no bigger than a computer mouse)  Cut down on sweet beverages. This includes juice, soda, and sweet tea.   Drink at least 1 glass of water with each meal and aim for at least 8 glasses per day  Exercise at least 150 minutes every week.

## 2023-05-25 NOTE — Assessment & Plan Note (Signed)
 Advised her to come back soon for CPE.  Will check A1c at that time.

## 2023-05-25 NOTE — Progress Notes (Signed)
 Connie Kirk is a 53 y.o. female who presents today for an office visit.  Assessment/Plan:  Rib Fracture Likely due to her frequent cough over the last several weeks.  No other obvious injuries or precipitating events.  Pain is currently manageable.  Anticipate that this will continue to improve over the next few weeks.  She can continue taking ibuprofen as needed.  Declined stronger pain meds at this time.  Given her atypical fracture will be reasonable for [place] to check bone density scan at this time.  Will place order.  We discussed reasons to return to care.  Cough Overall her lung exam today is reassuring.  She did have influenza few weeks ago and is likely having a postinfectious cough.  Given her reworsening symptoms it is also likely that she may have developed a secondary bacterial infection as well.  She is now on day 7 of doxycycline and will continue this for a 10-day course.  Due to her above rib fracture would be reasonable for [place] to treat her cough with prescription medication at this point.  Will send in Hycodan cough syrup.  We will also send in Tessalon to use as needed if she would like to have something that is less sedating.  Anticipate that this will continue to improve over the next 1 to 2 weeks however if symptoms do not improve or if she has any change in symptoms would consider chest CT scan at that time.  Chronic Problems Addressed Today: Hyperglycemia Advised her to come back soon for CPE.  Will check A1c at that time.  Dyslipidemia Check lipids next CPE.  Since her last visit she was started on Crestor 20 mg daily by cardiology.  She is tolerating well.     Subjective:  HPI:  Patient here today with cough and rib pain.  Symptoms have been going on for several weeks.  She had influenza about 5 weeks ago.  This was associated with typical symptoms including cough congestion and bodyaches.  Her symptoms resolved mostly though still had persistent cough.  She did  have her mammogram a few weeks after this.  Did not think any thing of it at that time however shortly after this noted pain in her right lower rib cage.  This persisted for several days and she ended up getting an x-ray at work.  This x-ray was negative for fracture or pneumonia.  She subsequently went to [place] to visit family.  While there she is still have persisting pain and worsening cough.  She was started on doxycycline and is now on day 7 of doxycycline.  She received a second x-ray at work recently which did demonstrate a fracture in her right seventh rib.  She still is having quite a bit of pain especially with cough which is still persistent.  Limited postnasal drip.  No fevers or chills.  She did have wheezing a few weeks ago but this has since resolved.        Objective:  Physical Exam: BP 138/87   Pulse 63   Temp (!) 97.3 F (36.3 C) (Temporal)   Ht 5\' 3"  (1.6 m)   Wt 158 lb (71.7 kg)   SpO2 100%   BMI 27.99 kg/m   Gen: No acute distress, resting comfortably CV: Regular rate and rhythm with no murmurs appreciated Pulm: Normal work of breathing, clear to auscultation bilaterally with no crackles, wheezes, or rhonchi Neuro: Grossly normal, moves all extremities Psych: Normal affect and thought  content      Sudeep Scheibel M. Jimmey Ralph, MD 05/25/2023 2:58 PM

## 2023-05-25 NOTE — Assessment & Plan Note (Addendum)
 Check lipids next CPE.  Since her last visit she was started on Crestor 20 mg daily by cardiology.  She is tolerating well.

## 2023-05-31 ENCOUNTER — Encounter: Payer: Self-pay | Admitting: Cardiology

## 2023-08-17 ENCOUNTER — Encounter: Payer: Self-pay | Admitting: Cardiology

## 2023-08-17 NOTE — Telephone Encounter (Signed)
Labs for review, please advise

## 2023-09-03 ENCOUNTER — Encounter: Admitting: Family Medicine
